# Patient Record
Sex: Female | Born: 1995 | Hispanic: No | Marital: Married | Smoking: Never smoker
Health system: Southern US, Community
[De-identification: ages and names within clinical notes are randomized; demographics above are authoritative.]

---

## 2019-12-30 HISTORY — PX: INCISION AND DRAINAGE PERITONSILLAR ABSCESS: SUR670

## 2020-01-24 ENCOUNTER — Emergency Department (HOSPITAL_BASED_OUTPATIENT_CLINIC_OR_DEPARTMENT_OTHER): Payer: 59

## 2020-01-24 ENCOUNTER — Emergency Department (INDEPENDENT_AMBULATORY_CARE_PROVIDER_SITE_OTHER)
Admission: EM | Admit: 2020-01-24 | Discharge: 2020-01-24 | Disposition: A | Discharge status: Home / Self Care | Payer: 59 | Source: Home / Self Care

## 2020-01-24 ENCOUNTER — Other Ambulatory Visit: Payer: Self-pay

## 2020-01-24 ENCOUNTER — Emergency Department (HOSPITAL_BASED_OUTPATIENT_CLINIC_OR_DEPARTMENT_OTHER)
Admission: EM | Admit: 2020-01-24 | Discharge: 2020-01-25 | Disposition: A | Discharge status: Home / Self Care | Payer: 59 | Attending: Emergency Medicine | Admitting: Emergency Medicine

## 2020-01-24 ENCOUNTER — Encounter: Payer: Self-pay | Admitting: Family Medicine

## 2020-01-24 ENCOUNTER — Encounter (HOSPITAL_BASED_OUTPATIENT_CLINIC_OR_DEPARTMENT_OTHER): Payer: Self-pay | Admitting: *Deleted

## 2020-01-24 DIAGNOSIS — Z20822 Contact with and (suspected) exposure to covid-19: Secondary | ICD-10-CM | POA: Diagnosis not present

## 2020-01-24 DIAGNOSIS — J039 Acute tonsillitis, unspecified: Secondary | ICD-10-CM | POA: Diagnosis not present

## 2020-01-24 DIAGNOSIS — J36 Peritonsillar abscess: Secondary | ICD-10-CM

## 2020-01-24 DIAGNOSIS — J029 Acute pharyngitis, unspecified: Secondary | ICD-10-CM | POA: Diagnosis not present

## 2020-01-24 DIAGNOSIS — R070 Pain in throat: Secondary | ICD-10-CM | POA: Diagnosis present

## 2020-01-24 LAB — CBC WITH DIFFERENTIAL/PLATELET
Abs Immature Granulocytes: 0.07 10*3/uL (ref 0.00–0.07)
Basophils Absolute: 0 10*3/uL (ref 0.0–0.1)
Basophils Relative: 0 %
Eosinophils Absolute: 0 10*3/uL (ref 0.0–0.5)
Eosinophils Relative: 0 %
HCT: 38.7 % (ref 36.0–46.0)
Hemoglobin: 13.5 g/dL (ref 12.0–15.0)
Immature Granulocytes: 0 %
Lymphocytes Relative: 8 %
Lymphs Abs: 1.5 10*3/uL (ref 0.7–4.0)
MCH: 30.5 pg (ref 26.0–34.0)
MCHC: 34.9 g/dL (ref 30.0–36.0)
MCV: 87.4 fL (ref 80.0–100.0)
Monocytes Absolute: 1.3 10*3/uL — ABNORMAL HIGH (ref 0.1–1.0)
Monocytes Relative: 7 %
Neutro Abs: 15.1 10*3/uL — ABNORMAL HIGH (ref 1.7–7.7)
Neutrophils Relative %: 85 %
Platelets: 285 10*3/uL (ref 150–400)
RBC: 4.43 MIL/uL (ref 3.87–5.11)
RDW: 12.8 % (ref 11.5–15.5)
WBC: 18 10*3/uL — ABNORMAL HIGH (ref 4.0–10.5)
nRBC: 0 % (ref 0.0–0.2)

## 2020-01-24 LAB — BASIC METABOLIC PANEL
Anion gap: 10 (ref 5–15)
BUN: 7 mg/dL (ref 6–20)
CO2: 24 mmol/L (ref 22–32)
Calcium: 8.9 mg/dL (ref 8.9–10.3)
Chloride: 99 mmol/L (ref 98–111)
Creatinine, Ser: 0.68 mg/dL (ref 0.44–1.00)
GFR, Estimated: >60 mL/min (ref >60)
Glucose, Bld: 109 mg/dL — ABNORMAL HIGH (ref 70–99)
Potassium: 3.7 mmol/L (ref 3.5–5.1)
Sodium: 133 mmol/L — ABNORMAL LOW (ref 135–145)

## 2020-01-24 LAB — GROUP A STREP BY PCR: Group A Strep by PCR: NOT DETECTED

## 2020-01-24 MED ORDER — PENICILLIN V POTASSIUM 500 MG PO TABS: 500.0000 mg | ORAL_TABLET | Freq: Three times a day (TID) | ORAL | 0 refills | Status: DC

## 2020-01-24 MED ORDER — HYDROCOD POLST-CPM POLST ER 10-8 MG/5ML PO SUER: 5.0000 mL | Freq: Two times a day (BID) | ORAL | 0 refills | Status: DC | PRN

## 2020-01-24 MED ORDER — SODIUM CHLORIDE 0.9 % IV BOLUS
1000.0000 mL | Freq: Once | INTRAVENOUS | Status: AC
  Administered 2020-01-24: 23:00:00 1000 mL via INTRAVENOUS

## 2020-01-24 NOTE — ED Provider Notes (Signed)
Emergency Department Provider Note   I have reviewed the triage vital signs and the nursing notes.   HISTORY  Chief Complaint Sore Throat   HPI Stacy Black is a 24 y.o. female with past medical history reviewed below presents emergency department with continued pain in her right neck and tonsil with associated swelling.  Patient was initially evaluated at [facility] 2 days ago.  She was given Decadron after her strep antigen test came back negative.  She was seen at the urgent care earlier today with continued symptoms and started on penicillin but since that time has had increased pain and swelling in the right neck and pain with swallowing making it difficult for her to do so.  She denies any trouble breathing.  No fevers but has had some fatigue.  Her appetite is very poor. No vomiting. She is here with a friend who provides some of the history and additional context to the case. No CP or SOB. No vomiting or diarrhea. No URI symptoms. Pain is moderate, constant, and worse with swallowing or touching the area.    History reviewed. No pertinent past medical history.  There are no problems to display for this patient.   History reviewed. No pertinent surgical history.  Allergies Patient has no known allergies.  Family History  Problem Relation Age of Onset  . Diabetes Father     Social History Social History   Tobacco Use  . Smoking status: Never Smoker  . Smokeless tobacco: Never Used  Vaping Use  . Vaping Use: Never used  Substance Use Topics  . Alcohol use: Never  . Drug use: Never    Review of Systems  Constitutional: No fever/chills Eyes: No visual changes. ENT: Positive sore throat with right lower jaw swelling.  Cardiovascular: Denies chest pain. Respiratory: Denies shortness of breath. Gastrointestinal: No abdominal pain.  No nausea, no vomiting.  No diarrhea.  No constipation. Poor PO intake.  Genitourinary: Negative for dysuria. Musculoskeletal:  Negative for back pain. Skin: Negative for rash. Neurological: Negative for headaches, focal weakness or numbness.  10-point ROS otherwise negative.  ____________________________________________   PHYSICAL EXAM:  VITAL SIGNS: ED Triage Vitals  Enc Vitals Group     BP 01/24/20 2207 120/82     Pulse Rate 01/24/20 2207 (!) 111     Resp 01/24/20 2207 18     Temp 01/24/20 2207 99.7 F (37.6 C)     Temp Source 01/24/20 2207 Oral     SpO2 01/24/20 2207 100 %     Weight 01/24/20 2208 153 lb (69.4 kg)     Height 01/24/20 2208 5\' 5"  (1.651 m)   Constitutional: Alert and oriented. Well appearing and in no acute distress. Eyes: Conjunctivae are normal.  Head: Atraumatic. Nose: No congestion/rhinnorhea. Mouth/Throat: Mucous membranes are moist.  Oropharynx with erythema and right tonsillar hypertrophy. No clear PTA clinically. Normal uvula. Clear voice and managing oral secretions but 3 finger-breadths of trismus (mild) and tender adenopathy along the right cervical chain. Soft submandibular compartment. No tongue swelling.  Neck: No stridor.   Cardiovascular: Normal rate, regular rhythm. Good peripheral circulation. Grossly normal heart sounds.   Respiratory: Normal respiratory effort.  No retractions. Lungs CTAB. Gastrointestinal: Soft and nontender. No distention.  Musculoskeletal: No gross deformities of extremities. Neurologic:  Normal speech and language.  Skin:  Skin is warm, dry and intact. No rash noted.  ____________________________________________   LABS (all labs ordered are listed, but only abnormal results are displayed)  Labs Reviewed  BASIC METABOLIC PANEL - Abnormal; Notable for the following components:      Result Value   Sodium 133 (*)    Glucose, Bld 109 (*)    All other components within normal limits  CBC WITH DIFFERENTIAL/PLATELET - Abnormal; Notable for the following components:   WBC 18.0 (*)    Neutro Abs 15.1 (*)    Monocytes Absolute 1.3 (*)    All  other components within normal limits  GROUP A STREP BY PCR  RESP PANEL BY RT-PCR (FLU A&B, COVID) [id number]   ____________________________________________  RADIOLOGY  CT Soft Tissue Neck W Contrast  Result Date: 01/25/2020 CLINICAL DATA:  Cellulitis of the neck. EXAM: CT NECK WITH CONTRAST TECHNIQUE: Multidetector CT imaging of the neck was performed using the standard protocol following the bolus administration of intravenous contrast. CONTRAST:  30mL OMNIPAQUE IOHEXOL 300 MG/ML  SOLN COMPARISON:  None. FINDINGS: Pharynx and larynx: Adenoid tissue is within normal limits. Prevertebral edema is noted. There is marked enlargement of the right palatine tonsil with diffuse inflammatory change. Peripherally enhancing low-density collection in the right parapharyngeal space measures 9 x 11 x 17 mm. Diffuse edematous changes extend into the right side of the vallecula and aryepiglottic fold. No other discrete fluid collection is present. Vocal cords are midline and symmetric. Trachea is normal. Salivary glands: Edematous changes surround the right submandibular gland. The right submandibular gland is edematous is well. No mass lesion or obstruction is present. Left submandibular gland and duct is normal. The parotid glands are normal bilaterally. Thyroid: Normal Lymph nodes: Enlarged right level 2 lymph nodes measure up to 2.6 cm. Other smaller ovoid nodes are present throughout the right level 2 and level 3 stations. No necrotic nodes are present. No significant left-sided adenopathy is present. Vascular: Unremarkable. Limited intracranial: Within normal limits. Visualized orbits: The globes and orbits are within normal limits. Mastoids and visualized paranasal sinuses: The paranasal sinuses and mastoid air cells are clear. Skeleton: Vertebral body heights and alignment are normal. There is some reversal of the normal cervical lordosis. Upper chest: Lung apices are clear. Thoracic inlet is within normal limits.  IMPRESSION: 1. Marked enlargement of the right palatine tonsil with diffuse inflammatory change compatible with acute tonsillitis. 2. Peripherally enhancing low-density collection in the right parapharyngeal space measures 9 x 11 x 17 mm, consistent with a developing peritonsillar abscess. 3. Edematous changes extend into the right side of the vallecula and aryepiglottic fold. 4. Enlarged right level 2 and level 3 lymph nodes are likely reactive. Electronically Signed   By: Marin Roberts M.D.   On: 01/25/2020 00:55   DG Chest Portable 1 View  Result Date: 01/24/2020 CLINICAL DATA:  Cough. Sore throat. EXAM: PORTABLE CHEST 1 VIEW COMPARISON:  None. FINDINGS: Heart size is normal. Ill-defined airspace opacity is present the left base. Lungs are otherwise clear. Visualized soft tissues and bony thorax are unremarkable. IMPRESSION: Ill-defined left lower lobe airspace disease concerning for pneumonia. Electronically Signed   By: Marin Roberts M.D.   On: 01/24/2020 23:41    ____________________________________________   PROCEDURES  Procedure(s) performed:   Procedures  None  ____________________________________________   INITIAL IMPRESSION / ASSESSMENT AND PLAN / ED COURSE  Pertinent labs & imaging results that were available during my care of the patient were reviewed by me and considered in my medical decision making (see chart for details).   Patient presents to the emergency department with worsening sore throat with cervical adenopathy and very mild trismus.  She is  managing oral secretions and able to provide a history.  She has been refractory to Decadron and has been started on penicillin with no improvement in symptoms.  Given her exam I will move forward with CT of the soft tissues of the neck to evaluate for deeper space abscess not visualized on exam.   01:00 AM  Spoke with Dr. Pollyann Kennedy regarding the CT imaging and results.  I do not appreciate a large peritonsillar  abscess that would be amenable to ED drainage at the bedside. Discussed the impressions and patient's clinical exam.  Plan to start clindamycin will have the patient follow in the office tomorrow.  She is tolerating her oral secretions and speaking in a clear voice.  She is in no acute distress.  Feeling improved after IV fluids.  IV clindamycin given prior to discharge.  Patient is to call the ENT office in the morning.  The patient's friend is at bedside and will assist her with this in the AM. Discussed ED return precautions.    ____________________________________________  FINAL CLINICAL IMPRESSION(S) / ED DIAGNOSES  Final diagnoses:  Tonsillitis  Peritonsillar abscess     MEDICATIONS GIVEN DURING THIS VISIT:  Medications  sodium chloride 0.9 % bolus 1,000 mL (0 mLs Intravenous Stopped 01/25/20 0044)  iohexol (OMNIPAQUE) 300 MG/ML solution 75 mL (75 mLs Intravenous Contrast Given 01/25/20 0019)  clindamycin (CLEOCIN) IVPB 600 mg (0 mg Intravenous Stopped 01/25/20 0146)     NEW OUTPATIENT MEDICATIONS STARTED DURING THIS VISIT:  Discharge Medication List as of 01/25/2020  1:28 AM    START taking these medications   Details  clindamycin (CLEOCIN) 150 MG capsule Take 3 capsules (450 mg total) by mouth 3 (three) times daily for 7 days., Starting Mon 01/25/2020, Until Mon 02/01/2020, Normal        Note:  This document was prepared using Dragon voice recognition software and may include unintentional dictation errors.  Alona Bene, MD, [facility] Emergency Medicine    Grettell Ransdell, Arlyss Repress, MD 01/25/20 [phone]

## 2020-01-24 NOTE — ED Triage Notes (Signed)
Pt seen in ER 2 days ago for swollen lymph nodes. Pt prescribed steroids. Culture sent off for strep. Pt comes in today for unbearable pain and unable to tolerate PO intake.

## 2020-01-24 NOTE — ED Triage Notes (Addendum)
Pt reports she has had sore throat since 12/22. She has been seen in the ED and then again today at Urgent Care. C/o swollen tonsils and sore throat. She was started on steroids and PCN and had negative rapid strep test today. Denies fever

## 2020-01-24 NOTE — ED Provider Notes (Signed)
[facility]    CSN: [id number] Arrival date & time: 01/24/20  4174      History   Chief Complaint Chief Complaint  Patient presents with  . Lymphadenopathy    Right    HPI Stacy Black is a 24 y.o. female.   This is the initial visit for this 24 year old woman presenting to [facility] urgent care.  She is complaining of sore throat.  It began 4 days ago and has gotten quite intense.  It is also associated with some swollen anterior cervical nodes and cough.  The cough is nonproductive.  Patient has had no fever, chills, or sweats.  Pt seen in ER 2 days ago for swollen lymph nodes. Pt prescribed steroids. Culture sent off for strep. Pt comes in today for unbearable pain and unable to tolerate PO intake.  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     History reviewed. No pertinent past medical history.  There are no problems to display for this patient.   History reviewed. No pertinent surgical history.  OB History   No obstetric history on file.      Home Medications    Prior to Admission medications   Medication Sig Start Date End Date Taking? Authorizing Provider  chlorpheniramine-HYDROcodone (TUSSIONEX PENNKINETIC ER) 10-8 MG/5ML SUER Take 5 mLs by mouth every 12 (twelve) hours as needed for cough. 01/24/20   Elvina Sidle, MD  penicillin v potassium (VEETID) 500 MG tablet Take 1 tablet (500 mg total) by mouth 3 (three) times daily. 01/24/20   Elvina Sidle, MD    Family History Family History  Problem Relation Age of Onset  . Diabetes Father     Social History Social History   Tobacco Use  . Smoking status: Never Smoker  . Smokeless tobacco: Never Used  Vaping Use  . Vaping Use: Never used  Substance Use Topics  . Alcohol use: Never  . Drug use: Never     Allergies   Patient has no known allergies.   Review of Systems Review of Systems  HENT: Positive for sore throat.    Respiratory: Positive for cough.      Physical Exam Triage Vital Signs ED Triage Vitals  Enc Vitals Group     BP      Pulse      Resp      Temp      Temp src      SpO2      Weight      Height      Head Circumference      Peak Flow      Pain Score      Pain Loc      Pain Edu?      Excl. in GC?    No data found.  Updated Vital Signs BP 124/86 (BP Location: Right Arm)   Pulse 80   Temp (!) 97.5 F (36.4 C) (Axillary)   Resp 14   Ht 5\' 5"  (1.651 m)   Wt 69.4 kg   LMP 12/27/2019 (Approximate)   SpO2 100%   BMI 25.46 kg/m    Physical Exam Vitals and nursing note reviewed.  Constitutional:      General: She is not in acute distress.    Appearance: Normal appearance. She is normal weight.  HENT:     Right Ear: Tympanic membrane normal.     Left Ear: Tympanic membrane normal.     Nose: Nose normal.  Mouth/Throat:     Mouth: Mucous membranes are moist.     Pharynx: Posterior oropharyngeal erythema present.  Eyes:     Conjunctiva/sclera: Conjunctivae normal.  Cardiovascular:     Rate and Rhythm: Normal rate.  Pulmonary:     Effort: Pulmonary effort is normal.     Breath sounds: Normal breath sounds.  Abdominal:     General: Abdomen is flat.     Palpations: There is no mass.     Tenderness: There is no abdominal tenderness.  Musculoskeletal:        General: Normal range of motion.     Cervical back: Normal range of motion and neck supple. Tenderness present.  Lymphadenopathy:     Cervical: Cervical adenopathy present.  Skin:    General: Skin is warm and dry.  Neurological:     General: No focal deficit present.     Mental Status: She is alert and oriented to person, place, and time.  Psychiatric:        Mood and Affect: Mood normal.        Behavior: Behavior normal.      UC Treatments / Results  Labs (all labs ordered are listed, but only abnormal results are displayed) Labs Reviewed - No data to display  EKG   Radiology No results  found.  Procedures Procedures (including critical care time)  Medications Ordered in UC Medications - No data to display  Initial Impression / Assessment and Plan / UC Course  I have reviewed the triage vital signs and the nursing notes.  Pertinent labs & imaging results that were available during my care of the patient were reviewed by me and considered in my medical decision making (see chart for details).     Final Clinical Impressions(s) / UC Diagnoses   Final diagnoses:  Acute pharyngitis, unspecified etiology   Discharge Instructions   None    ED Prescriptions    Medication Sig Dispense Auth. Provider   penicillin v potassium (VEETID) 500 MG tablet Take 1 tablet (500 mg total) by mouth 3 (three) times daily. 30 tablet Elvina Sidle, MD   chlorpheniramine-HYDROcodone [facility] ER) 10-8 MG/5ML SUER Take 5 mLs by mouth every 12 (twelve) hours as needed for cough. 115 mL Elvina Sidle, MD     I have reviewed the PDMP during this encounter.   Elvina Sidle, MD 01/24/20 [phone]

## 2020-01-25 ENCOUNTER — Emergency Department (HOSPITAL_BASED_OUTPATIENT_CLINIC_OR_DEPARTMENT_OTHER): Payer: 59

## 2020-01-25 ENCOUNTER — Encounter (HOSPITAL_BASED_OUTPATIENT_CLINIC_OR_DEPARTMENT_OTHER): Payer: Self-pay

## 2020-01-25 LAB — RESP PANEL BY RT-PCR (FLU A&B, COVID) ARPGX2
Influenza A by PCR: NEGATIVE
Influenza B by PCR: NEGATIVE
SARS Coronavirus 2 by RT PCR: NEGATIVE

## 2020-01-25 MED ORDER — CLINDAMYCIN PHOSPHATE 600 MG/50ML IV SOLN
600.0000 mg | Freq: Once | INTRAVENOUS | Status: AC
  Administered 2020-01-25: 01:00:00 600 mg via INTRAVENOUS
  Filled 2020-01-25: qty 50

## 2020-01-25 MED ORDER — CLINDAMYCIN HCL 150 MG PO CAPS: 450.0000 mg | ORAL_CAPSULE | Freq: Three times a day (TID) | ORAL | 0 refills | Status: AC

## 2020-01-25 MED ORDER — IOHEXOL 300 MG/ML  SOLN
75.0000 mL | Freq: Once | INTRAMUSCULAR | Status: AC | PRN
  Administered 2020-01-25: 75 mL via INTRAVENOUS

## 2020-01-25 NOTE — Discharge Instructions (Signed)
You were seen in the emerge department today with throat pain and swelling.  Your CT scan shows a developing peritonsillar abscess with inflammation of your right tonsil.  I am starting you on a stronger antibiotic and you have been given an IV dose of this antibiotic here.  Please call the ear nose and throat doctor listed first thing tomorrow morning to schedule a follow-up appointment later today.  Return to the emergency department with any new or suddenly worsening symptoms.

## 2020-01-30 DIAGNOSIS — I10 Essential (primary) hypertension: Secondary | ICD-10-CM

## 2020-01-30 HISTORY — DX: Essential (primary) hypertension: I10

## 2020-12-13 ENCOUNTER — Emergency Department (HOSPITAL_BASED_OUTPATIENT_CLINIC_OR_DEPARTMENT_OTHER)
Admission: EM | Admit: 2020-12-13 | Discharge: 2020-12-13 | Disposition: A | Discharge status: Home / Self Care | Payer: 59 | Attending: Emergency Medicine | Admitting: Emergency Medicine

## 2020-12-13 ENCOUNTER — Emergency Department (HOSPITAL_BASED_OUTPATIENT_CLINIC_OR_DEPARTMENT_OTHER): Payer: 59 | Admitting: Radiology

## 2020-12-13 ENCOUNTER — Encounter (HOSPITAL_BASED_OUTPATIENT_CLINIC_OR_DEPARTMENT_OTHER): Payer: Self-pay | Admitting: Emergency Medicine

## 2020-12-13 ENCOUNTER — Other Ambulatory Visit: Payer: Self-pay

## 2020-12-13 DIAGNOSIS — X501XXA Overexertion from prolonged static or awkward postures, initial encounter: Secondary | ICD-10-CM | POA: Diagnosis not present

## 2020-12-13 DIAGNOSIS — S93401A Sprain of unspecified ligament of right ankle, initial encounter: Secondary | ICD-10-CM | POA: Insufficient documentation

## 2020-12-13 DIAGNOSIS — S99911A Unspecified injury of right ankle, initial encounter: Secondary | ICD-10-CM | POA: Diagnosis present

## 2020-12-13 DIAGNOSIS — M25571 Pain in right ankle and joints of right foot: Secondary | ICD-10-CM | POA: Diagnosis not present

## 2020-12-13 MED ORDER — NAPROXEN 375 MG PO TABS: 375.0000 mg | ORAL_TABLET | Freq: Two times a day (BID) | ORAL | 0 refills | Status: DC

## 2020-12-13 MED ORDER — NAPROXEN 250 MG PO TABS
375.0000 mg | ORAL_TABLET | Freq: Once | ORAL | Status: AC
  Administered 2020-12-13: 375 mg via ORAL
  Filled 2020-12-13: qty 2

## 2020-12-13 NOTE — ED Provider Notes (Signed)
[facility] EMERGENCY DEPARTMENT Provider Note  CSN: [id number] Arrival date & time: 12/13/20 1324    History Chief Complaint  Patient presents with   Ankle Pain    Stacy Black is a 25 y.o. female reports she stumbled and fell last night twisting her R ankle. Reports moderate pain, worse with movement and standing. No other injuries   History reviewed. No pertinent past medical history.  History reviewed. No pertinent surgical history.  Family History  Problem Relation Age of Onset   Diabetes Father     Social History   Tobacco Use   Smoking status: Never   Smokeless tobacco: Never  Vaping Use   Vaping Use: Never used  Substance Use Topics   Alcohol use: Never   Drug use: Never     Home Medications Prior to Admission medications   Medication Sig Start Date End Date Taking? Authorizing Provider  naproxen (NAPROSYN) 375 MG tablet Take 1 tablet (375 mg total) by mouth 2 (two) times daily. 12/13/20  Yes Pollyann Savoy, MD     Allergies    Patient has no known allergies.   Review of Systems   Review of Systems A comprehensive review of systems was completed and negative except as noted in HPI.    Physical Exam BP 121/81 (BP Location: Right Arm)   Pulse 65   Temp 98.1 F (36.7 C) (Oral)   Resp 15   Ht 5\' 5"  (1.651 m)   Wt 69.4 kg   SpO2 100%   BMI 25.46 kg/m   Physical Exam Vitals and nursing note reviewed.  HENT:     Head: Normocephalic.     Nose: Nose normal.  Eyes:     Extraocular Movements: Extraocular movements intact.  Pulmonary:     Effort: Pulmonary effort is normal.  Musculoskeletal:        General: Swelling and tenderness (soft tissues of R ankle, no bony tenderness) present. No deformity.     Cervical back: Neck supple.  Skin:    Findings: No rash (on exposed skin).  Neurological:     Mental Status: She is alert and oriented to person, place, and time.  Psychiatric:        Mood and Affect: Mood normal.      ED Results / Procedures / Treatments   Labs (all labs ordered are listed, but only abnormal results are displayed) Labs Reviewed - No data to display  EKG None   Radiology DG Ankle Complete Right  Result Date: 12/13/2020 CLINICAL DATA:  25 year old female status post fall twisting ankle last night. EXAM: RIGHT ANKLE - COMPLETE 3+ VIEW COMPARISON:  None. FINDINGS: Bone mineralization is within normal limits. There is no evidence of fracture, dislocation, or joint effusion. There is no evidence of arthropathy or other focal bone abnormality. Soft tissues are unremarkable. IMPRESSION: Negative. Electronically Signed   By: 34 M.D.   On: 12/13/2020 07:41    Procedures Procedures  Medications Ordered in the ED Medications  naproxen (NAPROSYN) tablet 375 mg (has no administration in time range)     MDM Rules/Calculators/A&P MDM  Xray neg. Will provide ASO, crutches. Recommend RICE, naprosyn as needed. Ortho follow up if not improving.  ED Course  I have reviewed the triage vital signs and the nursing notes.  Pertinent labs & imaging results that were available during my care of the patient were reviewed by me and considered in my medical decision making (see chart for details).  Final Clinical Impression(s) / ED Diagnoses Final diagnoses:  Sprain of right ankle, unspecified ligament, initial encounter    Rx / DC Orders ED Discharge Orders          Ordered    naproxen (NAPROSYN) 375 MG tablet  2 times daily        12/13/20 0754             Pollyann Savoy, MD 12/13/20 [phone]

## 2020-12-13 NOTE — ED Triage Notes (Signed)
Pt arrives to ED with c/o of right ankle pain. She reports twisting it last night.

## 2021-01-06 ENCOUNTER — Other Ambulatory Visit: Payer: Self-pay

## 2021-01-06 ENCOUNTER — Encounter (HOSPITAL_BASED_OUTPATIENT_CLINIC_OR_DEPARTMENT_OTHER): Payer: Self-pay

## 2021-01-06 DIAGNOSIS — N3000 Acute cystitis without hematuria: Secondary | ICD-10-CM | POA: Diagnosis not present

## 2021-01-06 DIAGNOSIS — R350 Frequency of micturition: Secondary | ICD-10-CM | POA: Diagnosis present

## 2021-01-06 LAB — URINALYSIS, ROUTINE W REFLEX MICROSCOPIC
Bilirubin Urine: NEGATIVE
Glucose, UA: NEGATIVE mg/dL
Hgb urine dipstick: NEGATIVE
Ketones, ur: NEGATIVE mg/dL
Nitrite: NEGATIVE
Protein, ur: NEGATIVE mg/dL
Specific Gravity, Urine: 1.012 (ref 1.005–1.030)
pH: 6.5 (ref 5.0–8.0)

## 2021-01-06 LAB — PREGNANCY, URINE: Preg Test, Ur: NEGATIVE

## 2021-01-06 NOTE — ED Triage Notes (Signed)
Increased urgency and frequency of urination.

## 2021-01-07 ENCOUNTER — Emergency Department (HOSPITAL_BASED_OUTPATIENT_CLINIC_OR_DEPARTMENT_OTHER)
Admission: EM | Admit: 2021-01-07 | Discharge: 2021-01-07 | Disposition: A | Discharge status: Home / Self Care | Payer: 59 | Attending: Emergency Medicine | Admitting: Emergency Medicine

## 2021-01-07 DIAGNOSIS — N3000 Acute cystitis without hematuria: Secondary | ICD-10-CM

## 2021-01-07 LAB — CBG MONITORING, ED: Glucose-Capillary: 91 mg/dL (ref 70–99)

## 2021-01-07 MED ORDER — CEPHALEXIN 250 MG PO CAPS
500.0000 mg | ORAL_CAPSULE | Freq: Once | ORAL | Status: AC
  Administered 2021-01-07: 500 mg via ORAL
  Filled 2021-01-07: qty 2

## 2021-01-07 MED ORDER — CEPHALEXIN 500 MG PO CAPS: 500.0000 mg | ORAL_CAPSULE | Freq: Three times a day (TID) | ORAL | 0 refills | Status: DC

## 2021-01-07 NOTE — ED Provider Notes (Signed)
[facility] Provider Note   CSN: [id number] Arrival date & time: 01/06/21  2320     History Chief Complaint  Patient presents with   Urinary Frequency    Fair Brahmbhatt is a 25 y.o. female.  Patient reports a 1 week history of increased urination, frequency, urgency, going small amounts.  Denies any hematuria.  Denies any vaginal bleeding or discharge.  Denies any fevers, chills, nausea or vomiting. Denies any significant flank pain but does have diffuse low back pain over the past 1 week.  No abdominal pain.  No fever or chills.  Is never had the symptoms before. Describes urinary frequency, urgency, going small amounts with some burning and discomfort when she tries to urinate.  The history is provided by the patient.  Urinary Frequency Pertinent negatives include no chest pain, no abdominal pain, no headaches and no shortness of breath.      History reviewed. No pertinent past medical history.  There are no problems to display for this patient.   History reviewed. No pertinent surgical history.   OB History   No obstetric history on file.     Family History  Problem Relation Age of Onset   Diabetes Father     Social History   Tobacco Use   Smoking status: Never   Smokeless tobacco: Never  Vaping Use   Vaping Use: Never used  Substance Use Topics   Alcohol use: Yes   Drug use: Never    Home Medications Prior to Admission medications   Medication Sig Start Date End Date Taking? Authorizing Provider  naproxen (NAPROSYN) 375 MG tablet Take 1 tablet (375 mg total) by mouth 2 (two) times daily. 12/13/20   Truddie Hidden, MD    Allergies    Patient has no known allergies.  Review of Systems   Review of Systems  Constitutional:  Negative for activity change, appetite change and fever.  HENT:  Negative for congestion and rhinorrhea.   Respiratory:  Negative for cough, chest tightness and shortness of breath.    Cardiovascular:  Negative for chest pain.  Gastrointestinal:  Negative for abdominal pain, nausea and vomiting.  Genitourinary:  Positive for dysuria, frequency and urgency. Negative for decreased urine volume, hematuria and vaginal discharge.  Musculoskeletal:  Negative for arthralgias and myalgias.  Skin:  Negative for rash.  Neurological:  Negative for dizziness, weakness and headaches.   all other systems are negative except as noted in the HPI and PMH.   Physical Exam Updated Vital Signs BP 129/87   Pulse 78   Temp 98.2 F (36.8 C) (Oral)   Resp 14   Ht 5\' 6"  (1.676 m)   Wt 71 kg   LMP 12/14/2020 (Approximate)   SpO2 100%   BMI 25.26 kg/m   Physical Exam Vitals and nursing note reviewed.  Constitutional:      General: She is not in acute distress.    Appearance: She is well-developed.  HENT:     Head: Normocephalic and atraumatic.     Mouth/Throat:     Pharynx: No oropharyngeal exudate.  Eyes:     Conjunctiva/sclera: Conjunctivae normal.     Pupils: Pupils are equal, round, and reactive to light.  Neck:     Comments: No meningismus. Cardiovascular:     Rate and Rhythm: Normal rate and regular rhythm.     Heart sounds: Normal heart sounds. No murmur heard. Pulmonary:     Effort: Pulmonary effort is normal. No respiratory distress.  Breath sounds: Normal breath sounds.  Abdominal:     Palpations: Abdomen is soft.     Tenderness: There is no abdominal tenderness. There is no guarding or rebound.     Comments: No RLQ tenderness  Musculoskeletal:        General: Tenderness present. Normal range of motion.     Cervical back: Normal range of motion and neck supple.     Comments: Paraspinal lumbar tenderness  Skin:    General: Skin is warm.  Neurological:     Mental Status: She is alert and oriented to person, place, and time.     Cranial Nerves: No cranial nerve deficit.     Motor: No abnormal muscle tone.     Coordination: Coordination normal.      Comments:  5/5 strength throughout. CN 2-12 intact.Equal grip strength.   Psychiatric:        Behavior: Behavior normal.    ED Results / Procedures / Treatments   Labs (all labs ordered are listed, but only abnormal results are displayed) Labs Reviewed  URINALYSIS, ROUTINE W REFLEX MICROSCOPIC - Abnormal; Notable for the following components:      Result Value   APPearance HAZY (*)    Leukocytes,Ua MODERATE (*)    Bacteria, UA RARE (*)    All other components within normal limits  PREGNANCY, URINE  CBG MONITORING, ED    EKG None  Radiology No results found.  Procedures Procedures   Medications Ordered in ED Medications  cephALEXin (KEFLEX) capsule 500 mg (has no administration in time range)    ED Course  I have reviewed the triage vital signs and the nursing notes.  Pertinent labs & imaging results that were available during my care of the patient were reviewed by me and considered in my medical decision making (see chart for details).    MDM Rules/Calculators/A&P                          1 week of UTI symptoms.  No fever or abdominal pain or vomiting. Abdomen soft without peritoneal signs.  Low suspicion for infected kidney stone.  Urinalysis shows white cells and leukocyte esterase. HCG negative. Will send for culture and treat empirically for UTI  Return to the ED with worsening pain, fever, vomiting, any other concerns. Final Clinical Impression(s) / ED Diagnoses Final diagnoses:  Acute cystitis without hematuria    Rx / DC Orders ED Discharge Orders     None        Brieann Osinski, Jeannett Senior, MD 01/07/21 [phone]

## 2021-01-07 NOTE — Discharge Instructions (Signed)
Take antibiotics as prescribed and follow-up with your doctor.  Return to the ED with worsening pain, fever, vomiting, any other concerns

## 2021-01-09 LAB — URINE CULTURE: Culture: >=100000 — AB

## 2021-01-10 ENCOUNTER — Telehealth: Payer: Self-pay | Admitting: Emergency Medicine

## 2021-01-10 NOTE — Telephone Encounter (Signed)
Post ED Visit - Positive Culture Follow-up  Culture report reviewed by antimicrobial stewardship pharmacist: [facility] []  , Pharm.D. []  Enzo Bi, Pharm.D., BCPS AQ-ID []  , Pharm.D., BCPS []  Celedonio Miyamoto, .D., BCPS []  [place], .D., BCPS, AAHIVP []  Georgina Pillion, Pharm.D., BCPS, AAHIVP []  [address], PharmD, BCPS []  , PharmD, BCPS []  [place], PharmD, BCPS []  [place], PharmD []  , PharmD, BCPS []  Estella Husk, PharmD  Pharmacy Team []  Lysle Pearl, PharmD []  , PharmD []  Phillips Climes, PharmD []  , Rph []  Agapito Games) , PharmD []  Verlan Friends, PharmD []  , PharmD []  Mervyn Gay, PharmD []  , PharmD []  Vinnie Level, PharmD []  [facility], PharmD []  , PharmD []  Len Childs, PharmD   Positive urine culture Treated with cephalexin, organism sensitive to the same and no further patient follow-up is required at this time.  01/10/2021, 9:07 AM

## 2021-05-03 ENCOUNTER — Encounter (HOSPITAL_BASED_OUTPATIENT_CLINIC_OR_DEPARTMENT_OTHER): Payer: Self-pay | Admitting: Emergency Medicine

## 2021-05-03 ENCOUNTER — Other Ambulatory Visit: Payer: Self-pay

## 2021-05-03 ENCOUNTER — Emergency Department (HOSPITAL_BASED_OUTPATIENT_CLINIC_OR_DEPARTMENT_OTHER)
Admission: EM | Admit: 2021-05-03 | Discharge: 2021-05-03 | Disposition: A | Discharge status: Home / Self Care | Payer: 59 | Attending: Emergency Medicine | Admitting: Emergency Medicine

## 2021-05-03 DIAGNOSIS — R519 Headache, unspecified: Secondary | ICD-10-CM | POA: Diagnosis not present

## 2021-05-03 DIAGNOSIS — R200 Anesthesia of skin: Secondary | ICD-10-CM | POA: Diagnosis not present

## 2021-05-03 DIAGNOSIS — I10 Essential (primary) hypertension: Secondary | ICD-10-CM | POA: Insufficient documentation

## 2021-05-03 NOTE — Discharge Instructions (Addendum)
It was a pleasure taking care of you today!  ? ?You may take over the counter 1,000 mg tylenol every 6 hours or ibuprofen every 6 hours as needed for pain for no more than 7 days. You may also practice square breathing or journal as detailed during your visit to aid with your stressors. Return to the emergency department if you are experiencing, vision changes, chest pain, shortness of breath, or worsening symptoms. ?

## 2021-05-03 NOTE — ED Provider Notes (Signed)
?[facility] EM[facility] DEPT ?Provider Note ? ? ?CSN: [id number] ?Arrival date & time: 05/03/21  1220 ? ?  ? ?History ? ?Chief Complaint  ?Patient presents with  ? Headache  ? ? ?Stacy Black is a 26 y.o. female who presents to the emergency department complaining of a headache onset 6 days. Patient notes that her headache is resolved at this time.  Patient denies a past medical history of hypertension, however noted that her blood pressure prior to arrival was 160/140 however it decreased to 120/100 after 10 minutes.  Patient is she has had increased stressors at work 6 days ago prior to the onset of her symptoms.  Patient notes that she has bilateral finger/toe numbness, phonophobia, abdominal cramping.  She notes that her symptoms are noticeable when she feels stressed.  She denies a past medical history of anxiety or being treated for anxiety at this time. She has not tried any medications for her symptoms.  Patient denies chest pain, shortness of breath, vision changes, photophobia. ? ? ?The history is provided by the patient. No language interpreter was used.  ?Headache ?Associated symptoms: numbness (bilateral toes/fingers)   ?Associated symptoms: no abdominal pain, no dizziness, no fever, no nausea, no photophobia and no vomiting   ? ?  ? ?Home Medications ?Prior to Admission medications   ?Medication Sig Start Date End Date Taking? Authorizing Provider  ?cephALEXin (KEFLEX) 500 MG capsule Take 1 capsule (500 mg total) by mouth 3 (three) times daily. 01/07/21   Rancour, Jeannett Senior, MD  ?naproxen (NAPROSYN) 375 MG tablet Take 1 tablet (375 mg total) by mouth 2 (two) times daily. 12/13/20   Pollyann Savoy, MD  ?   ? ?Allergies    ?Patient has no known allergies.   ? ?Review of Systems   ?Review of Systems  ?Constitutional:  Positive for chills. Negative for fever.  ?Eyes:  Negative for photophobia and visual disturbance.  ?Respiratory:  Negative for shortness of breath.   ?Cardiovascular:  Positive  for palpitations. Negative for chest pain.  ?Gastrointestinal:  Negative for abdominal pain, nausea and vomiting.  ?Genitourinary:  Negative for dysuria and hematuria.  ?Neurological:  Positive for light-headedness, numbness (bilateral toes/fingers) and headaches. Negative for dizziness.  ?All other systems reviewed and are negative. ? ?Physical Exam ?Updated Vital Signs ?BP 128/84   Pulse 94   Temp 98 ?F (36.7 ?C)   Resp 20   SpO2 100%  ?Physical Exam ?Vitals and nursing note reviewed.  ?Constitutional:   ?   General: She is not in acute distress. ?   Appearance: She is not diaphoretic.  ?HENT:  ?   Head: Normocephalic and atraumatic.  ?   Mouth/Throat:  ?   Pharynx: No oropharyngeal exudate.  ?Eyes:  ?   General: No scleral icterus. ?   Conjunctiva/sclera: Conjunctivae normal.  ?Cardiovascular:  ?   Rate and Rhythm: Normal rate and regular rhythm.  ?   Pulses: Normal pulses.  ?   Heart sounds: Normal heart sounds.  ?Pulmonary:  ?   Effort: Pulmonary effort is normal. No respiratory distress.  ?   Breath sounds: Normal breath sounds. No wheezing.  ?Abdominal:  ?   General: Bowel sounds are normal.  ?   Palpations: Abdomen is soft. There is no mass.  ?   Tenderness: There is no abdominal tenderness. There is no guarding or rebound.  ?Musculoskeletal:     ?   General: Normal range of motion.  ?   Cervical back: Normal range  of motion and neck supple.  ?   Comments: Strength and sensation intact to bilateral upper and lower extremities.  ?Skin: ?   General: Skin is warm and dry.  ?Neurological:  ?   General: No focal deficit present.  ?   Mental Status: She is alert.  ?   Cranial Nerves: Cranial nerves 2-12 are intact.  ?   Sensory: Sensation is intact.  ?   Motor: Motor function is intact. No pronator drift.  ?Psychiatric:     ?   Behavior: Behavior normal.  ? ? ?ED Results / Procedures / Treatments   ?Labs ?(all labs ordered are listed, but only abnormal results are displayed) ?Labs Reviewed - No data to  display ? ?EKG ?None ? ?Radiology ?No results found. ? ?Procedures ?Procedures  ? ? ?Medications Ordered in ED ?Medications - No data to display ? ?ED Course/ Medical Decision Making/ A&P ?  ?                        ?Medical Decision Making ? ?Pt presented to the ED with headache onset 6 days.  She has a history of headaches before in the past.  Denies vision changes, chest pain, shortness of breath, dysuria, hematuria, photophobia.  Vital signs stable, patient afebrile, not tachycardic or hypoxic.  On exam patient without focal neurological deficits.  No vision changes.  No cardiovascular, respiratory, abdominal exam findings.  No spinal tenderness to palpation.  Differential diagnosis includes SAH, ICH, migraine, tension headache.  ? ? ?Disposition: ?Patient presentation suspicious for headache in the setting of recent increased work stressors and increased anxiety. Presentation less likely due to [facility] or ICH due to the absence of red flags. After consideration of the diagnostic results and the patients response to treatment, I feel that the patient would benefit from Discharge home.  Offered patient Tylenol or ibuprofen prior to discharge, patient notes that her headache is resolved and does not feel the need for medication at this time.  Discussed with patient that we may obtain a CT head however less likely at this time with concerns for intracranial abnormality.  Through shared decision making with patient and significant other at bedside, decision to forego CT head at this time was made.  I agree with this decision as patient does not have any focal neurological deficits on exam and there is an absence of red flags. Discussed with patient they may follow-up with their primary care provider as needed.  Supportive care measures and strict return precautions discussed with patient.  Patient knowledges and verbalized understanding.  Patient agreeable to discharge treatment plan. Patient appears safe for discharge  at this time.  Follow-up as indicated in the discharge paperwork. ? ? ?This chart was dictated using voice recognition software, Dragon. Despite the best efforts of this provider to proofread and correct errors, errors may still occur which can change documentation meaning. ? ?Final Clinical Impression(s) / ED Diagnoses ?Final diagnoses:  ?Acute nonintractable headache, unspecified headache type  ? ? ?Rx / DC Orders ?ED Discharge Orders   ? ? None  ? ?  ? ? ?  ?Maccoy Haubner A, PA-C ?05/03/21 2351 ? ?  ?Cheryll Cockayne, MD ?05/08/21 2314 ? ?

## 2021-05-03 NOTE — ED Triage Notes (Signed)
Pt complains of headache/numbness to her fingers bilaterally and pain in both legs. She states she thought her blood pressure was high, checked at home it was 120/90 and a 160/101. ?

## 2021-08-21 DIAGNOSIS — Z01419 Encounter for gynecological examination (general) (routine) without abnormal findings: Secondary | ICD-10-CM | POA: Diagnosis not present

## 2021-08-21 DIAGNOSIS — Z1151 Encounter for screening for human papillomavirus (HPV): Secondary | ICD-10-CM | POA: Diagnosis not present

## 2021-08-21 DIAGNOSIS — Z6826 Body mass index (BMI) 26.0-26.9, adult: Secondary | ICD-10-CM | POA: Diagnosis not present

## 2021-10-21 ENCOUNTER — Other Ambulatory Visit: Payer: Self-pay

## 2021-10-21 ENCOUNTER — Encounter (HOSPITAL_BASED_OUTPATIENT_CLINIC_OR_DEPARTMENT_OTHER): Payer: Self-pay

## 2021-10-21 DIAGNOSIS — O26891 Other specified pregnancy related conditions, first trimester: Secondary | ICD-10-CM | POA: Insufficient documentation

## 2021-10-21 DIAGNOSIS — Z3A01 Less than 8 weeks gestation of pregnancy: Secondary | ICD-10-CM | POA: Diagnosis not present

## 2021-10-21 DIAGNOSIS — R1030 Lower abdominal pain, unspecified: Secondary | ICD-10-CM | POA: Diagnosis not present

## 2021-10-21 DIAGNOSIS — R42 Dizziness and giddiness: Secondary | ICD-10-CM | POA: Diagnosis not present

## 2021-10-21 DIAGNOSIS — O219 Vomiting of pregnancy, unspecified: Secondary | ICD-10-CM | POA: Insufficient documentation

## 2021-10-21 LAB — CBC
HCT: 38.4 % (ref 36.0–46.0)
Hemoglobin: 13.7 g/dL (ref 12.0–15.0)
MCH: 29.8 pg (ref 26.0–34.0)
MCHC: 35.7 g/dL (ref 30.0–36.0)
MCV: 83.7 fL (ref 80.0–100.0)
Platelets: 297 10*3/uL (ref 150–400)
RBC: 4.59 MIL/uL (ref 3.87–5.11)
RDW: 12.6 % (ref 11.5–15.5)
WBC: 16.2 10*3/uL — ABNORMAL HIGH (ref 4.0–10.5)
nRBC: 0 % (ref 0.0–0.2)

## 2021-10-21 NOTE — ED Triage Notes (Signed)
Patient arrives with complaints of dizziness, nausea/vomiting, and abdominal pain. Patient states that she is about [redacted] weeks pregnant. No vaginal bleeding.   Reports increased fatigue and weakness as well. Rates pain a 7/10.

## 2021-10-22 ENCOUNTER — Emergency Department (HOSPITAL_COMMUNITY): Payer: BC Managed Care – PPO

## 2021-10-22 ENCOUNTER — Emergency Department (HOSPITAL_BASED_OUTPATIENT_CLINIC_OR_DEPARTMENT_OTHER)
Admission: EM | Admit: 2021-10-22 | Discharge: 2021-10-22 | Disposition: A | Discharge status: Home / Self Care | Payer: BC Managed Care – PPO | Attending: Emergency Medicine | Admitting: Emergency Medicine

## 2021-10-22 ENCOUNTER — Encounter (HOSPITAL_COMMUNITY): Payer: Self-pay

## 2021-10-22 DIAGNOSIS — O26891 Other specified pregnancy related conditions, first trimester: Secondary | ICD-10-CM | POA: Diagnosis not present

## 2021-10-22 DIAGNOSIS — Z3A01 Less than 8 weeks gestation of pregnancy: Secondary | ICD-10-CM | POA: Diagnosis not present

## 2021-10-22 DIAGNOSIS — R1030 Lower abdominal pain, unspecified: Secondary | ICD-10-CM | POA: Diagnosis not present

## 2021-10-22 DIAGNOSIS — O219 Vomiting of pregnancy, unspecified: Secondary | ICD-10-CM

## 2021-10-22 DIAGNOSIS — Z3490 Encounter for supervision of normal pregnancy, unspecified, unspecified trimester: Secondary | ICD-10-CM

## 2021-10-22 LAB — URINALYSIS, ROUTINE W REFLEX MICROSCOPIC
Bilirubin Urine: NEGATIVE
Glucose, UA: NEGATIVE mg/dL
Hgb urine dipstick: NEGATIVE
Ketones, ur: NEGATIVE mg/dL
Leukocytes,Ua: NEGATIVE
Nitrite: NEGATIVE
Protein, ur: NEGATIVE mg/dL
Specific Gravity, Urine: 1.01 (ref 1.005–1.030)
pH: 7.5 (ref 5.0–8.0)

## 2021-10-22 LAB — COMPREHENSIVE METABOLIC PANEL
ALT: 22 U/L (ref 0–44)
AST: 15 U/L (ref 15–41)
Albumin: 4.4 g/dL (ref 3.5–5.0)
Alkaline Phosphatase: 70 U/L (ref 38–126)
Anion gap: 12 (ref 5–15)
BUN: 9 mg/dL (ref 6–20)
CO2: 18 mmol/L — ABNORMAL LOW (ref 22–32)
Calcium: 9.7 mg/dL (ref 8.9–10.3)
Chloride: 103 mmol/L (ref 98–111)
Creatinine, Ser: 0.55 mg/dL (ref 0.44–1.00)
GFR, Estimated: >60 mL/min (ref >60)
Glucose, Bld: 102 mg/dL — ABNORMAL HIGH (ref 70–99)
Potassium: 3.8 mmol/L (ref 3.5–5.1)
Sodium: 133 mmol/L — ABNORMAL LOW (ref 135–145)
Total Bilirubin: 0.5 mg/dL (ref 0.3–1.2)
Total Protein: 7.6 g/dL (ref 6.5–8.1)

## 2021-10-22 LAB — HCG, QUANTITATIVE, PREGNANCY: hCG, Beta Chain, Quant, S: 57251 m[IU]/mL — ABNORMAL HIGH (ref <5)

## 2021-10-22 LAB — PREGNANCY, URINE: Preg Test, Ur: POSITIVE — AB

## 2021-10-22 LAB — LIPASE, BLOOD: Lipase: 12 U/L (ref 11–51)

## 2021-10-22 MED ORDER — NITROFURANTOIN MONOHYD MACRO 100 MG PO CAPS: 100.0000 mg | ORAL_CAPSULE | Freq: Two times a day (BID) | ORAL | 0 refills | Status: DC

## 2021-10-22 MED ORDER — ONDANSETRON HCL 4 MG/2ML IJ SOLN
4.0000 mg | Freq: Once | INTRAMUSCULAR | Status: AC
  Administered 2021-10-22: 4 mg via INTRAVENOUS
  Filled 2021-10-22: qty 2

## 2021-10-22 MED ORDER — SODIUM CHLORIDE 0.9 % IV BOLUS
500.0000 mL | Freq: Once | INTRAVENOUS | Status: AC
  Administered 2021-10-22: 500 mL via INTRAVENOUS

## 2021-10-22 MED ORDER — PROMETHAZINE HCL 12.5 MG PO TABS: 12.5000 mg | ORAL_TABLET | Freq: Four times a day (QID) | ORAL | 0 refills | Status: DC | PRN

## 2021-10-22 NOTE — ED Notes (Signed)
Pt instructed to present to MAU at [facility]. Pt and significant other both verbalized understanding. IV secured. Pt ambulated from ED with steady gait.

## 2021-10-22 NOTE — MAU Note (Signed)
..  Stacy Black is a 26 y.o. at Unknown here in MAU reporting from [facility] ED: Lower abdominal pain and vomiting, reports she was given zofran in ED. Reports she was not able to eat anything for the last 5 days.  LMP: 09/05/2021  Pain score: 5/10 Vitals:   10/22/21 0300 10/22/21 0405  BP: 104/67 106/69  Pulse: 71 81  Resp: 18 17  Temp: 98.7 F (37.1 C) 98.1 F (36.7 C)  SpO2: 100% 100%

## 2021-10-22 NOTE — ED Provider Notes (Signed)
[facility] DE[facility] Provider Note   CSN: [id number] Arrival date & time: 10/21/21  2255     History  Chief Complaint  Patient presents with   Emesis   Dizziness   Abdominal Pain    Stacy Black is a 26 y.o. female.  The history is provided by the patient.  Emesis Severity:  Moderate Timing:  Intermittent Quality:  Stomach contents Associated symptoms: abdominal pain   Associated symptoms: no fever   Dizziness Associated symptoms: vomiting   Abdominal Pain Associated symptoms: vomiting   Associated symptoms: no fever and no vaginal bleeding   Illness Location:  Body Quality:  Nausea and vomiting and lower abdominal pain.  LMP September 05 2021.  G1P0 Severity:  Moderate Onset quality:  Gradual Timing:  Constant Progression:  Unchanged Chronicity:  New Context:  Pregnant x 6 weeks Relieved by:  Nothing Worsened by:  Nothing Ineffective treatments:  None Associated symptoms: abdominal pain and vomiting   Associated symptoms: no fever   Patient states she is B positive.       Home Medications Prior to Admission medications   Medication Sig Start Date End Date Taking? Authorizing Provider  nitrofurantoin, macrocrystal-monohydrate, (MACROBID) 100 MG capsule Take 1 capsule (100 mg total) by mouth 2 (two) times daily. X 7 days 10/22/21  Yes Harwood Nall, MD  cephALEXin (KEFLEX) 500 MG capsule Take 1 capsule (500 mg total) by mouth 3 (three) times daily. 01/07/21   Rancour, Annie Main, MD  naproxen (NAPROSYN) 375 MG tablet Take 1 tablet (375 mg total) by mouth 2 (two) times daily. 12/13/20   Truddie Hidden, MD      Allergies    Patient has no known allergies.    Review of Systems   Review of Systems  Constitutional:  Negative for fever.  HENT:  Negative for facial swelling.   Eyes:  Negative for redness.  Gastrointestinal:  Positive for abdominal pain and vomiting.  Genitourinary:  Negative for vaginal bleeding.  Neurological:  Positive  for light-headedness.  All other systems reviewed and are negative.   Physical Exam Updated Vital Signs BP 100/65   Pulse 79   Temp 98.9 F (37.2 C) (Oral)   Resp 18   Ht 5\' 6"  (1.676 m)   Wt 73.5 kg   LMP 09/05/2021   SpO2 100%   BMI 26.15 kg/m  Physical Exam Vitals and nursing note reviewed. Exam conducted with a chaperone present.  Constitutional:      General: She is not in acute distress.    Appearance: Normal appearance. She is well-developed.  HENT:     Head: Normocephalic and atraumatic.     Nose: Nose normal.  Eyes:     Pupils: Pupils are equal, round, and reactive to light.  Cardiovascular:     Rate and Rhythm: Normal rate and regular rhythm.     Pulses: Normal pulses.     Heart sounds: Normal heart sounds.  Pulmonary:     Effort: Pulmonary effort is normal. No respiratory distress.     Breath sounds: Normal breath sounds.  Abdominal:     General: Abdomen is flat. Bowel sounds are normal. There is no distension.     Palpations: Abdomen is soft.     Tenderness: There is no abdominal tenderness. There is no guarding or rebound.  Genitourinary:    Vagina: No vaginal discharge.  Musculoskeletal:        General: Normal range of motion.     Cervical back: Neck supple.  Skin:    General: Skin is warm and dry.     Capillary Refill: Capillary refill takes less than 2 seconds.     Findings: No erythema or rash.  Neurological:     General: No focal deficit present.     Mental Status: She is alert.     Deep Tendon Reflexes: Reflexes normal.  Psychiatric:        Mood and Affect: Mood normal.     ED Results / Procedures / Treatments   Labs (all labs ordered are listed, but only abnormal results are displayed) Results for orders placed or performed during the hospital encounter of 10/22/21  Lipase, blood  Result Value Ref Range   Lipase 12 11 - 51 U/L  Comprehensive metabolic panel  Result Value Ref Range   Sodium 133 (L) 135 - 145 mmol/L   Potassium 3.8  3.5 - 5.1 mmol/L   Chloride 103 98 - 111 mmol/L   CO2 18 (L) 22 - 32 mmol/L   Glucose, Bld 102 (H) 70 - 99 mg/dL   BUN 9 6 - 20 mg/dL   Creatinine, Ser 7.82 0.44 - 1.00 mg/dL   Calcium 9.7 8.9 - 95.6 mg/dL   Total Protein 7.6 6.5 - 8.1 g/dL   Albumin 4.4 3.5 - 5.0 g/dL   AST 15 15 - 41 U/L   ALT 22 0 - 44 U/L   Alkaline Phosphatase 70 38 - 126 U/L   Total Bilirubin 0.5 0.3 - 1.2 mg/dL   GFR, Estimated >21 >30 mL/min   Anion gap 12 5 - 15  CBC  Result Value Ref Range   WBC 16.2 (H) 4.0 - 10.5 K/uL   RBC 4.59 3.87 - 5.11 MIL/uL   Hemoglobin 13.7 12.0 - 15.0 g/dL   HCT 86.5 78.4 - 69.6 %   MCV 83.7 80.0 - 100.0 fL   MCH 29.8 26.0 - 34.0 pg   MCHC 35.7 30.0 - 36.0 g/dL   RDW 29.5 28.4 - 13.2 %   Platelets 297 150 - 400 K/uL   nRBC 0.0 0.0 - 0.2 %  Urinalysis, Routine w reflex microscopic  Result Value Ref Range   Color, Urine YELLOW YELLOW   APPearance HAZY (A) CLEAR   Specific Gravity, Urine 1.010 1.005 - 1.030   pH 7.5 5.0 - 8.0   Glucose, UA NEGATIVE NEGATIVE mg/dL   Hgb urine dipstick NEGATIVE NEGATIVE   Bilirubin Urine NEGATIVE NEGATIVE   Ketones, ur NEGATIVE NEGATIVE mg/dL   Protein, ur NEGATIVE NEGATIVE mg/dL   Nitrite NEGATIVE NEGATIVE   Leukocytes,Ua NEGATIVE NEGATIVE   RBC / HPF 0-5 0 - 5 RBC/hpf   WBC, UA 0-5 0 - 5 WBC/hpf   Bacteria, UA RARE (A) NONE SEEN   Squamous Epithelial / LPF 0-5 0 - 5   Mucus PRESENT    Hyaline Casts, UA PRESENT   Pregnancy, urine  Result Value Ref Range   Preg Test, Ur POSITIVE (A) NEGATIVE  hCG, quantitative, pregnancy  Result Value Ref Range   hCG, Beta Chain, Quant, S 57,251 (H) <5 mIU/mL   No results found.  EKG EKG Interpretation  Date/Time:  Saturday October 21 2021 23:38:58 EDT Ventricular Rate:  74 PR Interval:  120 QRS Duration: 74 QT Interval:  384 QTC Calculation: 426 R Axis:   62 Text Interpretation: Normal sinus rhythm with sinus arrhythmia Confirmed by Nicanor Alcon, Aysel Gilchrest ([id number]) on 10/21/2021 11:47:06  PM  Radiology No results found.  Procedures Procedures    Medications Ordered  in ED Medications  sodium chloride 0.9 % bolus 500 mL (0 mLs Intravenous Stopped 10/22/21 0211)  ondansetron (ZOFRAN) injection 4 mg (4 mg Intravenous Given 10/22/21 0141)    ED Course/ Medical Decision Making/ A&P                           Medical Decision Making Patient who is approximately [redacted] weeks pregnant and nausea and vomiting and abdominal pain   Amount and/or Complexity of Data Reviewed Independent Historian: spouse    Details: See above  External Data Reviewed:     Details: Previous notes reviewed Labs: ordered.    Details: Pregnancy test is positive.  Beta HCG is 57, 25.  Urine with small bacteria.  White count 16.2 normal hemoglobin and platelet counts.  Normal lipase 12.  Sodium slight low 133, normal potassium 3.8 normal creatinine .55  Discussion of management or test interpretation with external provider(s): Case d/w Dr. Despina Hidden, transfer to MAU for testing   Risk Prescription drug management. Risk Details: POV to MAU for further testing and care.  Macrobid for asymptomatic bacteuria given.      Final Clinical Impression(s) / ED Diagnoses Final diagnoses:  None    Rx / DC Orders ED Discharge Orders          Ordered    nitrofurantoin, macrocrystal-monohydrate, (MACROBID) 100 MG capsule  2 times daily        10/22/21 0253              Lekendrick Alpern, MD 10/22/21 2774

## 2021-10-22 NOTE — MAU Provider Note (Signed)
History     CSN: [id number]  Arrival date and time: 10/21/21 2255   Event Date/Time   First Provider Initiated Contact with Patient 10/22/21 [phone]      Chief Complaint  Patient presents with   Emesis   Dizziness   Abdominal Pain   HPI Stacy Black is a 26 y.o. G1P0 at [redacted]w[redacted]d who presents to MAU from the ED at [facility] with chief complaint of abdominal pain, emesis and dizziness in the setting of pregnancy of unknown location. These are recurrent problems, onset five days ago. Patient reports vomiting 4-5 times per day. She does not have medication to help her manage this problem.  She reports feeling dizzy whenever she stands or sits with her legs in a dependent position. She denies chest pain, palpitations, weakness or syncope. She is not experiencing vaginal bleeding, dysuria, back pain or abnormal vaginal discharge.  OB History     Gravida  1   Para      Term      Preterm      AB      Living         SAB      IAB      Ectopic      Multiple      Live Births              No past medical history on file.  No past surgical history on file.  Family History  Problem Relation Age of Onset   Diabetes Father     Social History   Tobacco Use   Smoking status: Never   Smokeless tobacco: Never  Vaping Use   Vaping Use: Never used  Substance Use Topics   Alcohol use: Yes   Drug use: Never    Allergies: No Known Allergies  Medications Prior to Admission  Medication Sig Dispense Refill Last Dose   cephALEXin (KEFLEX) 500 MG capsule Take 1 capsule (500 mg total) by mouth 3 (three) times daily. 21 capsule 0    naproxen (NAPROSYN) 375 MG tablet Take 1 tablet (375 mg total) by mouth 2 (two) times daily. 15 tablet 0     Review of Systems  Constitutional:  Positive for fatigue.  Gastrointestinal:  Positive for abdominal pain, nausea and vomiting.  Neurological:  Positive for dizziness.  All other systems reviewed and are negative.  Physical Exam    Blood pressure 116/67, pulse 68, temperature 98.4 F (36.9 C), temperature source Oral, resp. rate 17, height 5\' 6"  (1.676 m), weight 74.5 kg, last menstrual period 09/05/2021, SpO2 100 %.  Physical Exam Vitals and nursing note reviewed. Exam conducted with a chaperone present.  Constitutional:      Appearance: She is well-developed. She is not ill-appearing.  Cardiovascular:     Rate and Rhythm: Normal rate.  Pulmonary:     Effort: Pulmonary effort is normal.  Abdominal:     General: Abdomen is flat.     Palpations: Abdomen is soft.  Skin:    Capillary Refill: Capillary refill takes less than 2 seconds.  Neurological:     Mental Status: She is alert and oriented to person, place, and time.     MAU Course  Procedures  MDM Orders Placed This Encounter  Procedures   US OB LESS THAN 14 WEEKS WITH OB TRANSVAGINAL   Diet NPO time specified   Discharge patient   Patient Vitals for the past 24 hrs:  BP Temp Temp src Pulse Resp SpO2 Height Weight  10/22/21 0532 116/67 98.4 F (36.9 C) Oral 68 17 100 % -- --  10/22/21 0405 106/69 98.1 F (36.7 C) Oral 81 17 100 % 5\' 6"  (1.676 m) 74.5 kg  10/22/21 0300 104/67 98.7 F (37.1 C) Oral 71 18 100 % -- --  10/22/21 0230 100/65 -- -- 79 18 100 % -- --  10/22/21 0109 (!) 112/57 98.9 F (37.2 C) Oral 68 20 100 % -- --  10/21/21 2333 -- -- -- -- -- 100 % -- --  10/21/21 2331 122/77 98.2 F (36.8 C) Oral 83 20 100 % 5\' 6"  (1.676 m) 73.5 kg   Results for orders placed or performed during the hospital encounter of 10/22/21 (from the past 24 hour(s))  Lipase, blood     Status: None   Collection Time: 10/21/21 11:40 PM  Result Value Ref Range   Lipase 12 11 - 51 U/L  Comprehensive metabolic panel     Status: Abnormal   Collection Time: 10/21/21 11:40 PM  Result Value Ref Range   Sodium 133 (L) 135 - 145 mmol/L   Potassium 3.8 3.5 - 5.1 mmol/L   Chloride 103 98 - 111 mmol/L   CO2 18 (L) 22 - 32 mmol/L   Glucose, Bld 102 (H) 70 - 99  mg/dL   BUN 9 6 - 20 mg/dL   Creatinine, Ser 10/23/21 0.44 - 1.00 mg/dL   Calcium 9.7 8.9 - 10/23/21 mg/dL   Total Protein 7.6 6.5 - 8.1 g/dL   Albumin 4.4 3.5 - 5.0 g/dL   AST 15 15 - 41 U/L   ALT 22 0 - 44 U/L   Alkaline Phosphatase 70 38 - 126 U/L   Total Bilirubin 0.5 0.3 - 1.2 mg/dL   GFR, Estimated 1.09 32.3 mL/min   Anion gap 12 5 - 15  CBC     Status: Abnormal   Collection Time: 10/21/21 11:40 PM  Result Value Ref Range   WBC 16.2 (H) 4.0 - 10.5 K/uL   RBC 4.59 3.87 - 5.11 MIL/uL   Hemoglobin 13.7 12.0 - 15.0 g/dL   HCT >73 10/23/21 - 22.0 %   MCV 83.7 80.0 - 100.0 fL   MCH 29.8 26.0 - 34.0 pg   MCHC 35.7 30.0 - 36.0 g/dL   RDW 25.4 27.0 - 62.3 %   Platelets 297 150 - 400 K/uL   nRBC 0.0 0.0 - 0.2 %  Urinalysis, Routine w reflex microscopic     Status: Abnormal   Collection Time: 10/21/21 11:40 PM  Result Value Ref Range   Color, Urine YELLOW YELLOW   APPearance HAZY (A) CLEAR   Specific Gravity, Urine 1.010 1.005 - 1.030   pH 7.5 5.0 - 8.0   Glucose, UA NEGATIVE NEGATIVE mg/dL   Hgb urine dipstick NEGATIVE NEGATIVE   Bilirubin Urine NEGATIVE NEGATIVE   Ketones, ur NEGATIVE NEGATIVE mg/dL   Protein, ur NEGATIVE NEGATIVE mg/dL   Nitrite NEGATIVE NEGATIVE   Leukocytes,Ua NEGATIVE NEGATIVE   RBC / HPF 0-5 0 - 5 RBC/hpf   WBC, UA 0-5 0 - 5 WBC/hpf   Bacteria, UA RARE (A) NONE SEEN   Squamous Epithelial / LPF 0-5 0 - 5   Mucus PRESENT    Hyaline Casts, UA PRESENT   Pregnancy, urine     Status: Abnormal   Collection Time: 10/21/21 11:40 PM  Result Value Ref Range   Preg Test, Ur POSITIVE (A) NEGATIVE  hCG, quantitative, pregnancy     Status: Abnormal  Collection Time: 10/21/21 11:40 PM  Result Value Ref Range   hCG, Beta Chain, Quant, S 57,251 (H) <5 mIU/mL   US OB LESS THAN 14 WEEKS WITH OB TRANSVAGINAL  Result Date: 10/22/2021 CLINICAL DATA:  26 year old pregnant female with history of lower abdominal pain. EXAM: OBSTETRIC <14 WK [place] AND TRANSVAGINAL OB US TECHNIQUE:  Both transabdominal and transvaginal ultrasound examinations were performed for complete evaluation of the gestation as well as the maternal uterus, adnexal regions, and pelvic cul-de-sac. Transvaginal technique was performed to assess early pregnancy. COMPARISON:  No priors. FINDINGS: Intrauterine gestational sac: Single Yolk sac:  Present Embryo:  Present Cardiac Activity: Present Heart Rate: 118 bpm CRL:  59.2 mm   6 w   3 d                  [place] EDC: 06/14/2022. Subchorionic hemorrhage:  None visualized. Maternal uterus/adnexae: Uterus and ovaries are otherwise unremarkable in appearance bilaterally. No free fluid in the cul-de-sac. IMPRESSION: 1. Single viable IUP with estimated gestational age of [redacted] weeks and 3 days and normal fetal heart rate of 118 beats per minute. No acute findings are noted. Electronically Signed   By: Trudie Reed M.D.   On: 10/22/2021 05:23    Meds ordered this encounter  Medications   promethazine (PHENERGAN) 12.5 MG tablet    Sig: Take 1 tablet (12.5 mg total) by mouth every 6 (six) hours as needed for nausea or vomiting.    Dispense:  30 tablet    Refill:  0    Order Specific Question:   Supervising Provider    Answer:   Lazaro Arms [[id number]]   Assessment and Plan  --26 y.o. G1P0 with confirmed live IUP c/w dates --Vomiting without ketonuria --New outpatient regimen for management of emesis --Encouraged grazing throughout the day, solids before sips of liquid --Discharge home in stable condition  Calvert Cantor, MSA, MSN, CNM 10/22/2021, 5:46 AM

## 2021-10-22 NOTE — Discharge Instructions (Signed)
Safe Medications in Pregnancy   Acne: Benzoyl Peroxide Salicylic Acid  Backache/Headache: Tylenol: 2 regular strength every 4 hours OR              2 Extra strength every 6 hours  Colds/Coughs/Allergies: Benadryl (alcohol free) 25 mg every 6 hours as needed Breath right strips Claritin Cepacol throat lozenges Chloraseptic throat spray Cold-Eeze- up to three times per day Cough drops, alcohol free Flonase (by prescription only) Guaifenesin Mucinex Robitussin DM (plain only, alcohol free) Saline nasal spray/drops Sudafed (pseudoephedrine) & Actifed ** use only after [redacted] weeks gestation and if you do not have high blood pressure Tylenol Vicks Vaporub Zinc lozenges Zyrtec   Constipation: Colace Ducolax suppositories Fleet enema Glycerin suppositories Metamucil Milk of magnesia Miralax Senokot Smooth move tea  Diarrhea: Kaopectate Imodium A-D  *NO pepto Bismol  Hemorrhoids: Anusol Anusol HC Preparation H Tucks  Indigestion: Tums Maalox Mylanta Zantac  Pepcid  Insomnia: Benadryl (alcohol free) 25mg  every 6 hours as needed Tylenol PM Unisom, no Gelcaps  Leg Cramps: Tums MagGel  Nausea/Vomiting:  Bonine Dramamine Emetrol Ginger extract Sea bands Meclizine  Nausea medication to take during pregnancy:  Unisom (doxylamine succinate 25 mg tablets) Take one tablet daily at bedtime. If symptoms are not adequately controlled, the dose can be increased to a maximum recommended dose of two tablets daily (1/2 tablet in the morning, 1/2 tablet mid-afternoon and one at bedtime). Vitamin B6 100mg  tablets. Take one tablet twice a day (up to 200 mg per day).  Skin Rashes: Aveeno products Benadryl cream or 25mg  every 6 hours as needed Calamine Lotion 1% cortisone cream  Yeast infection: Gyne-lotrimin 7 Monistat 7   **If taking multiple medications, please check labels to avoid duplicating the same active ingredients **take medication as directed on  the label ** Do not exceed 4000 mg of tylenol in 24 hours **Do not take medications that contain aspirin or ibuprofen   Vomiting in First Trimester Follow these instructions at home: To help relieve your symptoms, listen to your body. Everyone is different and has different preferences. Find what works best for you. Here are some things you can try to help relieve your symptoms: Meals and snacks Eat 5-6 small meals daily instead of 3 large meals. Eating small meals and snacks can help you avoid an empty stomach. Before getting out of bed, eat a couple of crackers to avoid moving around on an empty stomach. Eat a protein-rich snack before bed. Examples include cheese and crackers, or a peanut butter sandwich made with 1 slice of whole-wheat bread and 1 tsp (5 g) of peanut butter. Eat and drink slowly. Try eating starchy foods as these are usually tolerated well. Examples include cereal, toast, bread, potatoes, pasta, rice, and pretzels. Eat at least one serving of protein with your meals and snacks. Protein options include lean meats, poultry, seafood, beans, nuts, nut butters, eggs, cheese, and yogurt. Eat or suck on things that have ginger in them. It may help to relieve nausea. Add  tsp (0.44 g) ground ginger to hot tea, or choose ginger tea.   Fluids It is important to stay hydrated. Try to: Drink small amounts of fluids often. Drink fluids 30 minutes before or after a meal to help lessen the feeling of a full stomach. Drink 100% fruit juice or an electrolyte drink. An electrolyte drink contains sodium, potassium, and chloride. Drink fluids that are cold, clear, and carbonated or sour. These include lemonade, ginger ale, lemon-lime soda, ice water, and sparkling  water. Things to avoid Avoid the following: Eating foods that trigger your symptoms. These may include spicy foods, coffee, high-fat foods, very sweet foods, and acidic foods. Drinking more than 1 cup of fluid at a  time. Skipping meals. Nausea can be more intense on an empty stomach. If you cannot tolerate food, do not force it. Try sucking on ice chips or other frozen items and make up for missed calories later. Lying down within 2 hours after eating. Being exposed to environmental triggers. These may include food smells, smoky rooms, closed spaces, rooms with strong smells, warm or humid places, overly loud and noisy rooms, and rooms with motion or flickering lights. Try eating meals in a well-ventilated area that is free of strong smells. Making quick and sudden changes in your movement. Taking iron pills and multivitamins that contain iron. If you take prescription iron pills, do not stop taking them unless your health care provider approves. Preparing food. The smell of food can spoil your appetite or trigger nausea. General instructions Brush your teeth or use a mouth rinse after meals. Take over-the-counter and prescription medicines only as told by your health care provider. Follow instructions from your health care provider about eating or drinking restrictions. Talk with your health care provider about starting a supplement of vitamin B6. Continue to take your prenatal vitamins as told by your health care provider. If you are having trouble taking your prenatal vitamins, talk with your health care provider about other options. Keep all follow-up visits. This is important. Follow-up visits include prenatal visits. Contact a health care provider if: You have pain in your abdomen. You have a severe headache. You have vision problems. You are losing weight. You feel weak or dizzy. You cannot eat or drink without vomiting, especially if this goes on for a full day. Get help right away if: You cannot drink fluids without vomiting. You vomit blood. You have constant nausea and vomiting. You are very weak. You faint. You have a fever and your symptoms suddenly get worse. Summary Making some  changes to your eating habits may help relieve nausea and vomiting. This condition may be managed with lifestyle changes and medicines as prescribed by your health care provider. If medicines do not help relieve nausea and vomiting, you may need to receive fluids through an IV at the hospital. This information is not intended to replace advice given to you by your health care provider. Make sure you discuss any questions you have with your health care provider. Document Revised: 08/10/2019 Document Reviewed: 08/10/2019 Elsevier Patient Education  2021 [employer].

## 2021-10-26 DIAGNOSIS — Z7689 Persons encountering health services in other specified circumstances: Secondary | ICD-10-CM | POA: Diagnosis not present

## 2021-10-26 DIAGNOSIS — O219 Vomiting of pregnancy, unspecified: Secondary | ICD-10-CM | POA: Diagnosis not present

## 2021-10-28 ENCOUNTER — Inpatient Hospital Stay (HOSPITAL_COMMUNITY)
Admission: AD | Admit: 2021-10-28 | Discharge: 2021-10-28 | Disposition: A | Discharge status: Home / Self Care | Payer: BC Managed Care – PPO | Attending: Obstetrics and Gynecology | Admitting: Obstetrics and Gynecology

## 2021-10-28 ENCOUNTER — Encounter (HOSPITAL_COMMUNITY): Payer: Self-pay | Admitting: Obstetrics and Gynecology

## 2021-10-28 DIAGNOSIS — Z3A01 Less than 8 weeks gestation of pregnancy: Secondary | ICD-10-CM | POA: Diagnosis not present

## 2021-10-28 DIAGNOSIS — O210 Mild hyperemesis gravidarum: Secondary | ICD-10-CM | POA: Insufficient documentation

## 2021-10-28 DIAGNOSIS — E860 Dehydration: Secondary | ICD-10-CM | POA: Insufficient documentation

## 2021-10-28 DIAGNOSIS — O99281 Endocrine, nutritional and metabolic diseases complicating pregnancy, first trimester: Secondary | ICD-10-CM | POA: Diagnosis not present

## 2021-10-28 LAB — URINALYSIS, ROUTINE W REFLEX MICROSCOPIC
Bilirubin Urine: NEGATIVE
Glucose, UA: NEGATIVE mg/dL
Hgb urine dipstick: NEGATIVE
Ketones, ur: NEGATIVE mg/dL
Leukocytes,Ua: NEGATIVE
Nitrite: NEGATIVE
Protein, ur: NEGATIVE mg/dL
Specific Gravity, Urine: 1.019 (ref 1.005–1.030)
pH: 8 (ref 5.0–8.0)

## 2021-10-28 LAB — BASIC METABOLIC PANEL
Anion gap: 11 (ref 5–15)
BUN: 8 mg/dL (ref 6–20)
CO2: 21 mmol/L — ABNORMAL LOW (ref 22–32)
Calcium: 9.3 mg/dL (ref 8.9–10.3)
Chloride: 104 mmol/L (ref 98–111)
Creatinine, Ser: 0.61 mg/dL (ref 0.44–1.00)
GFR, Estimated: >60 mL/min (ref >60)
Glucose, Bld: 82 mg/dL (ref 70–99)
Potassium: 3.7 mmol/L (ref 3.5–5.1)
Sodium: 136 mmol/L (ref 135–145)

## 2021-10-28 LAB — CBC
HCT: 37.3 % (ref 36.0–46.0)
Hemoglobin: 13.2 g/dL (ref 12.0–15.0)
MCH: 30.3 pg (ref 26.0–34.0)
MCHC: 35.4 g/dL (ref 30.0–36.0)
MCV: 85.6 fL (ref 80.0–100.0)
Platelets: 277 10*3/uL (ref 150–400)
RBC: 4.36 MIL/uL (ref 3.87–5.11)
RDW: 12.6 % (ref 11.5–15.5)
WBC: 13.8 10*3/uL — ABNORMAL HIGH (ref 4.0–10.5)
nRBC: 0 % (ref 0.0–0.2)

## 2021-10-28 MED ORDER — SCOPOLAMINE 1 MG/3DAYS TD PT72: 1.0000 | MEDICATED_PATCH | TRANSDERMAL | 1 refills | Status: DC

## 2021-10-28 MED ORDER — FAMOTIDINE 20 MG PO TABS: 20.0000 mg | ORAL_TABLET | Freq: Every day | ORAL | 1 refills | Status: AC

## 2021-10-28 MED ORDER — FAMOTIDINE IN NACL 20-0.9 MG/50ML-% IV SOLN
20.0000 mg | Freq: Once | INTRAVENOUS | Status: AC
  Administered 2021-10-28: 20 mg via INTRAVENOUS
  Filled 2021-10-28: qty 50

## 2021-10-28 MED ORDER — METOCLOPRAMIDE HCL 5 MG/ML IJ SOLN
10.0000 mg | Freq: Once | INTRAMUSCULAR | Status: AC
  Administered 2021-10-28: 10 mg via INTRAVENOUS
  Filled 2021-10-28: qty 2

## 2021-10-28 MED ORDER — METOCLOPRAMIDE HCL 10 MG PO TABS: 10.0000 mg | ORAL_TABLET | Freq: Four times a day (QID) | ORAL | 1 refills | Status: DC | PRN

## 2021-10-28 MED ORDER — LACTATED RINGERS IV BOLUS
1000.0000 mL | Freq: Once | INTRAVENOUS | Status: AC
  Administered 2021-10-28: 1000 mL via INTRAVENOUS

## 2021-10-28 MED ORDER — SCOPOLAMINE 1 MG/3DAYS TD PT72
1.0000 | MEDICATED_PATCH | TRANSDERMAL | Status: DC
  Administered 2021-10-28: 1.5 mg via TRANSDERMAL
  Filled 2021-10-28: qty 1

## 2021-10-28 NOTE — MAU Provider Note (Signed)
History     CSN: [id number]  Arrival date and time: 10/28/21 1409   Event Date/Time   First Provider Initiated Contact with Patient 10/28/21 1526      Chief Complaint  Patient presents with   Emesis   Dizziness   Abdominal Pain   26 y.o. G1 @[redacted]w[redacted]d  with IUP presenting with ongoing N/V. Reports worsening sx in the last 2 days. Reports several episodes yesterday and 4 episodes today. Denies fever but reports chills at night. Denies abdominal pain but stomach feels sensitive. She has tried Phenergan but it hasn't helped.    OB History     Gravida  1   Para      Term      Preterm      AB      Living         SAB      IAB      Ectopic      Multiple      Live Births              Past Medical History:  Diagnosis Date   Hypertension 2022    Past Surgical History:  Procedure Laterality Date   INCISION AND DRAINAGE PERITONSILLAR ABSCESS Right 12/2019    Family History  Problem Relation Age of Onset   Diabetes Father     Social History   Tobacco Use   Smoking status: Never   Smokeless tobacco: Never  Vaping Use   Vaping Use: Never used  Substance Use Topics   Alcohol use: Yes   Drug use: Never    Allergies: No Known Allergies  Medications Prior to Admission  Medication Sig Dispense Refill Last Dose   nitrofurantoin, macrocrystal-monohydrate, (MACROBID) 100 MG capsule Take 1 capsule (100 mg total) by mouth 2 (two) times daily. X 7 days 14 capsule 0 Past Week   promethazine (PHENERGAN) 12.5 MG tablet Take 1 tablet (12.5 mg total) by mouth every 6 (six) hours as needed for nausea or vomiting. 30 tablet 0 10/28/2021 at 0900    Review of Systems  Constitutional:  Positive for chills. Negative for fever.  Gastrointestinal:  Positive for nausea and vomiting. Negative for abdominal pain and diarrhea.  Genitourinary:  Negative for vaginal bleeding.   Physical Exam   Blood pressure 102/67, pulse 82, temperature 98.2 F (36.8 C), temperature source  Oral, resp. rate 16, height 5\' 6"  (1.676 m), weight 74.4 kg, last menstrual period 09/05/2021, SpO2 100 %.  Physical Exam Vitals and nursing note reviewed.  Constitutional:      General: She is not in acute distress.    Appearance: Normal appearance.  HENT:     Head: Normocephalic and atraumatic.  Cardiovascular:     Rate and Rhythm: Normal rate.  Pulmonary:     Effort: Pulmonary effort is normal. No respiratory distress.  Abdominal:     General: There is no distension.     Palpations: Abdomen is soft. There is no mass.     Tenderness: There is no abdominal tenderness. There is no guarding or rebound.     Hernia: No hernia is present.  Musculoskeletal:        General: Normal range of motion.     Cervical back: Normal range of motion.  Skin:    General: Skin is warm and dry.     Comments: Lips cracked  Neurological:     General: No focal deficit present.     Mental Status: She is alert and oriented to  person, place, and time.  Psychiatric:        Mood and Affect: Mood normal.        Behavior: Behavior normal.    Results for orders placed or performed during the hospital encounter of 10/28/21 (from the past 24 hour(s))  Urinalysis, Routine w reflex microscopic Urine, Clean Catch     Status: Abnormal   Collection Time: 10/28/21  2:35 PM  Result Value Ref Range   Color, Urine YELLOW YELLOW   APPearance TURBID (A) CLEAR   Specific Gravity, Urine 1.019 1.005 - 1.030   pH 8.0 5.0 - 8.0   Glucose, UA NEGATIVE NEGATIVE mg/dL   Hgb urine dipstick NEGATIVE NEGATIVE   Bilirubin Urine NEGATIVE NEGATIVE   Ketones, ur NEGATIVE NEGATIVE mg/dL   Protein, ur NEGATIVE NEGATIVE mg/dL   Nitrite NEGATIVE NEGATIVE   Leukocytes,Ua NEGATIVE NEGATIVE   RBC / HPF 6-10 0 - 5 RBC/hpf   WBC, UA 21-50 0 - 5 WBC/hpf   Bacteria, UA RARE (A) NONE SEEN   Squamous Epithelial / LPF 11-20 0 - 5   WBC Clumps PRESENT    Mucus PRESENT   CBC     Status: Abnormal   Collection Time: 10/28/21  5:34 PM   Result Value Ref Range   WBC 13.8 (H) 4.0 - 10.5 K/uL   RBC 4.36 3.87 - 5.11 MIL/uL   Hemoglobin 13.2 12.0 - 15.0 g/dL   HCT 37.3 36.0 - 46.0 %   MCV 85.6 80.0 - 100.0 fL   MCH 30.3 26.0 - 34.0 pg   MCHC 35.4 30.0 - 36.0 g/dL   RDW 12.6 11.5 - 15.5 %   Platelets 277 150 - 400 K/uL   nRBC 0.0 0.0 - 0.2 %  Basic metabolic panel     Status: Abnormal   Collection Time: 10/28/21  5:34 PM  Result Value Ref Range   Sodium 136 135 - 145 mmol/L   Potassium 3.7 3.5 - 5.1 mmol/L   Chloride 104 98 - 111 mmol/L   CO2 21 (L) 22 - 32 mmol/L   Glucose, Bld 82 70 - 99 mg/dL   BUN 8 6 - 20 mg/dL   Creatinine, Ser 0.61 0.44 - 1.00 mg/dL   Calcium 9.3 8.9 - 10.3 mg/dL   GFR, Estimated >60 >60 mL/min   Anion gap 11 5 - 15   MAU Course  Procedures Scopolamine LR Reglan Pepcid  MDM Labs ordered and reviewed. Feeling better. No further emesis. Tolerating po fluids and crackers. Stable for discharge home.  Assessment and Plan   1. [redacted] weeks gestation of pregnancy   2. Dehydration   3. Morning sickness    Discharge home Follow up with OB as scheduled Rx Reglan Rx Scop Rx Pepcid Return precautions  Allergies as of 10/28/2021   No Known Allergies      Medication List     STOP taking these medications    nitrofurantoin (macrocrystal-monohydrate) 100 MG capsule Commonly known as: Macrobid   promethazine 12.5 MG tablet Commonly known as: PHENERGAN       TAKE these medications    famotidine 20 MG tablet Commonly known as: PEPCID Take 1 tablet (20 mg total) by mouth at bedtime.   metoCLOPramide 10 MG tablet Commonly known as: Reglan Take 1 tablet (10 mg total) by mouth every 6 (six) hours as needed for nausea or vomiting.   scopolamine 1 MG/3DAYS Commonly known as: TRANSDERM-SCOP Place 1 patch (1.5 mg total) onto the skin every 3 (three)  days. Start taking on: October 31, 2021        Donette Larry, [place] 10/28/2021, 6:46 PM

## 2021-10-28 NOTE — MAU Note (Signed)
...  Stacy Black is a 26 y.o. at [redacted]w[redacted]d here in MAU reporting: 4 episodes of emesis today as well as constant nausea. She reports she is feeling weak and dizzy and it is hard for her to stand without needing to sit down. She states her medications are not working. Denies urinary urgency and burning with urination. Denies VB.   Last doses: Phenergan this morning at 0930  She reports she has only taken two doses of the previously prescribed Macrobid as it upsets her stomach.  Onset of complaint: x1 week Pain score: 6/10 upper abdominal  Lab orders placed from triage:  UA

## 2021-10-29 LAB — CULTURE, OB URINE: Culture: <10000 — AB

## 2021-11-01 ENCOUNTER — Emergency Department (HOSPITAL_COMMUNITY): Payer: BC Managed Care – PPO

## 2021-11-01 ENCOUNTER — Other Ambulatory Visit: Payer: Self-pay

## 2021-11-01 ENCOUNTER — Emergency Department (HOSPITAL_BASED_OUTPATIENT_CLINIC_OR_DEPARTMENT_OTHER)
Admission: EM | Admit: 2021-11-01 | Discharge: 2021-11-02 | Discharge status: Against Medical Advice | Payer: BC Managed Care – PPO | Attending: Emergency Medicine | Admitting: Emergency Medicine

## 2021-11-01 ENCOUNTER — Encounter (HOSPITAL_BASED_OUTPATIENT_CLINIC_OR_DEPARTMENT_OTHER): Payer: Self-pay

## 2021-11-01 ENCOUNTER — Encounter (HOSPITAL_COMMUNITY): Payer: Self-pay

## 2021-11-01 DIAGNOSIS — R42 Dizziness and giddiness: Secondary | ICD-10-CM | POA: Diagnosis not present

## 2021-11-01 DIAGNOSIS — R519 Headache, unspecified: Secondary | ICD-10-CM | POA: Insufficient documentation

## 2021-11-01 DIAGNOSIS — H539 Unspecified visual disturbance: Secondary | ICD-10-CM

## 2021-11-01 DIAGNOSIS — Z3A08 8 weeks gestation of pregnancy: Secondary | ICD-10-CM | POA: Insufficient documentation

## 2021-11-01 DIAGNOSIS — Z3A01 Less than 8 weeks gestation of pregnancy: Secondary | ICD-10-CM | POA: Diagnosis not present

## 2021-11-01 DIAGNOSIS — O26891 Other specified pregnancy related conditions, first trimester: Secondary | ICD-10-CM | POA: Diagnosis not present

## 2021-11-01 DIAGNOSIS — I10 Essential (primary) hypertension: Secondary | ICD-10-CM | POA: Insufficient documentation

## 2021-11-01 DIAGNOSIS — Z5321 Procedure and treatment not carried out due to patient leaving prior to being seen by health care provider: Secondary | ICD-10-CM | POA: Diagnosis not present

## 2021-11-01 LAB — COMPREHENSIVE METABOLIC PANEL
ALT: 19 U/L (ref 0–44)
AST: 13 U/L — ABNORMAL LOW (ref 15–41)
Albumin: 4.4 g/dL (ref 3.5–5.0)
Alkaline Phosphatase: 69 U/L (ref 38–126)
Anion gap: 11 (ref 5–15)
BUN: 8 mg/dL (ref 6–20)
CO2: 18 mmol/L — ABNORMAL LOW (ref 22–32)
Calcium: 9.4 mg/dL (ref 8.9–10.3)
Chloride: 103 mmol/L (ref 98–111)
Creatinine, Ser: 0.57 mg/dL (ref 0.44–1.00)
GFR, Estimated: >60 mL/min (ref >60)
Glucose, Bld: 98 mg/dL (ref 70–99)
Potassium: 3.6 mmol/L (ref 3.5–5.1)
Sodium: 132 mmol/L — ABNORMAL LOW (ref 135–145)
Total Bilirubin: 0.4 mg/dL (ref 0.3–1.2)
Total Protein: 7.4 g/dL (ref 6.5–8.1)

## 2021-11-01 LAB — URINALYSIS, ROUTINE W REFLEX MICROSCOPIC
Bilirubin Urine: NEGATIVE
Glucose, UA: NEGATIVE mg/dL
Hgb urine dipstick: NEGATIVE
Ketones, ur: NEGATIVE mg/dL
Leukocytes,Ua: NEGATIVE
Nitrite: NEGATIVE
Protein, ur: NEGATIVE mg/dL
Specific Gravity, Urine: 1.02 (ref 1.005–1.030)
pH: 6.5 (ref 5.0–8.0)

## 2021-11-01 LAB — CBC WITH DIFFERENTIAL/PLATELET
Abs Immature Granulocytes: 0.1 10*3/uL — ABNORMAL HIGH (ref 0.00–0.07)
Basophils Absolute: 0 10*3/uL (ref 0.0–0.1)
Basophils Relative: 0 %
Eosinophils Absolute: 0.2 10*3/uL (ref 0.0–0.5)
Eosinophils Relative: 1 %
HCT: 36.7 % (ref 36.0–46.0)
Hemoglobin: 13.2 g/dL (ref 12.0–15.0)
Immature Granulocytes: 1 %
Lymphocytes Relative: 17 %
Lymphs Abs: 2.3 10*3/uL (ref 0.7–4.0)
MCH: 29.9 pg (ref 26.0–34.0)
MCHC: 36 g/dL (ref 30.0–36.0)
MCV: 83.2 fL (ref 80.0–100.0)
Monocytes Absolute: 0.9 10*3/uL (ref 0.1–1.0)
Monocytes Relative: 6 %
Neutro Abs: 10.3 10*3/uL — ABNORMAL HIGH (ref 1.7–7.7)
Neutrophils Relative %: 75 %
Platelets: 299 10*3/uL (ref 150–400)
RBC: 4.41 MIL/uL (ref 3.87–5.11)
RDW: 12.8 % (ref 11.5–15.5)
WBC: 13.9 10*3/uL — ABNORMAL HIGH (ref 4.0–10.5)
nRBC: 0 % (ref 0.0–0.2)

## 2021-11-01 NOTE — ED Triage Notes (Addendum)
Pt states she is [redacted] weeks pregnant. Pt states she has been dizzy and is having issues with her near vision, even with glasses on. Husband states pt has had disorientation.  First OB appt 10/13

## 2021-11-01 NOTE — MAU Note (Signed)
MAU provider Hansel Feinstein, CNM was called by ED provider and told that pt was sent over to MAU accidentally.   This RN called ED charge nurse, and accepted pt This RN wheeled pt to ED registration.

## 2021-11-01 NOTE — ED Notes (Signed)
Report given to charge RN at [facility] ED.

## 2021-11-01 NOTE — MAU Provider Note (Signed)
Got a call from Unit Secretary at [facility] ED that this patient who was seen at [facility] ED was sent to [facility] ED, but she is [redacted] weeks pregnant so secretary is having her brought over here to MAU  It is clear from ED note that Dr Oswald Hillock has already accepted her for Brain MRI into [facility] ED.  Our Charge Nurse confirmed this with [facility] ED Charge Nurse who instructed her to return patient to [facility] ED.   Patient arrived by private car.  Seabron Spates, CNM

## 2021-11-01 NOTE — ED Notes (Signed)
[facility] being taken [facility]

## 2021-11-01 NOTE — ED Provider Notes (Signed)
[facility] EMERGENCY DEPT Provider Note   CSN: [id number] Arrival date & time: 11/01/21  1627     History  Chief Complaint  Patient presents with   Dizziness    Stacy Black is a 26 y.o. female.   Dizziness    Patient with previous history of hypertension, roughly [redacted] weeks gestational age 3 confirmed as of 10/22/2021 presents today due to dizziness, headache, blurry vision to both eyes and "not acting right/acting disoriented".    Patient symptoms started yesterday morning when she woke up.  She is normally nearsighted with glasses, even with her glasses on she is having blurry vision now of close to her face as well as far away.  Is actually worse when she is wearing her corrective lenses.  She has pain when she moves her eyes in both sides, also having double vision in both eyes.  She has a headache which comes and goes as well as right-sided neck pain.  She is able to walk but feels unsteady on her feet, at rest and with walking she feels like she is going to pass out or like she is unstable.  Minute this is never happened before, no history of neurologic disease.  She denies any abdominal pain or vaginal bleeding.  No recent head trauma or injuries.  Patient was previously on scopolamine patches, stopped taking it yesterday secondary to symptoms.  She takes Reglan for nausea and vomiting, no other medicine daily.  Of note, patient is not from [place] and does not speak [place].  Perfectly fluent in [place].  Home Medications Prior to Admission medications   Medication Sig Start Date End Date Taking? Authorizing Provider  famotidine (PEPCID) 20 MG tablet Take 1 tablet (20 mg total) by mouth at bedtime. 10/28/21   Julianne Handler, CNM  metoCLOPramide (REGLAN) 10 MG tablet Take 1 tablet (10 mg total) by mouth every 6 (six) hours as needed for nausea or vomiting. 10/28/21   Julianne Handler, CNM  scopolamine (TRANSDERM-SCOP) 1 MG/3DAYS Place 1 patch (1.5 mg  total) onto the skin every 3 (three) days. 10/31/21   Julianne Handler, CNM      Allergies    Patient has no known allergies.    Review of Systems   Review of Systems  Neurological:  Positive for dizziness.    Physical Exam Updated Vital Signs BP 109/74 (BP Location: Right Arm)   Pulse 75   Temp 98.1 F (36.7 C) (Oral)   Resp 16   Ht 5\' 6"  (1.676 m)   Wt 74.4 kg   LMP 09/05/2021   SpO2 100%   BMI 26.47 kg/m  Physical Exam Vitals and nursing note reviewed. Exam conducted with a chaperone present.  Constitutional:      Appearance: Normal appearance.  HENT:     Head: Normocephalic and atraumatic.     Mouth/Throat:     Comments: Right TMJ pain Eyes:     General: No scleral icterus.       Right eye: No discharge.        Left eye: No discharge.     Extraocular Movements: Extraocular movements intact.     Pupils: Pupils are equal, round, and reactive to light.     Comments: Pain with EOM but EOMs are roughly intact without nystagmus.  Peripheral fields are roughly intact confrontation, diplopia bilaterally per patient.  No change in red saturation to either eye.  Cardiovascular:     Rate and Rhythm: Normal rate and regular rhythm.  Pulses: Normal pulses.     Heart sounds: Normal heart sounds. No murmur heard.    No friction rub. No gallop.  Pulmonary:     Effort: Pulmonary effort is normal. No respiratory distress.     Breath sounds: Normal breath sounds.  Abdominal:     General: Abdomen is flat. Bowel sounds are normal. There is no distension.     Palpations: Abdomen is soft.     Tenderness: There is no abdominal tenderness.  Musculoskeletal:        General: No tenderness.  Skin:    General: Skin is warm and dry.     Coloration: Skin is not jaundiced.  Neurological:     Mental Status: She is alert. Mental status is at baseline.     Coordination: Coordination normal.     Comments: Cranial nerves II through XII are grossly intact, upper and lower extremity  strength is roughly symmetric.  No pronator drift, normal finger-nose.  Able to raise both lower extremities at difficulty, plantarflexion dorsiflexion 5/5 against resistance.  Oriented x3.     ED Results / Procedures / Treatments   Labs (all labs ordered are listed, but only abnormal results are displayed) Labs Reviewed  CBC WITH DIFFERENTIAL/PLATELET - Abnormal; Notable for the following components:      Result Value   WBC 13.9 (*)    Neutro Abs 10.3 (*)    Abs Immature Granulocytes 0.10 (*)    All other components within normal limits  COMPREHENSIVE METABOLIC PANEL - Abnormal; Notable for the following components:   Sodium 132 (*)    CO2 18 (*)    AST 13 (*)    All other components within normal limits  URINALYSIS, ROUTINE W REFLEX MICROSCOPIC    EKG None  Radiology No results found.  Procedures Procedures    Medications Ordered in ED Medications - No data to display  ED Course/ Medical Decision Making/ A&P                           Medical Decision Making Amount and/or Complexity of Data Reviewed Labs: ordered. Radiology: ordered.   Patient presents due to headache, dizziness, mental status changes, neck pain and blurry vision during eighth week of pregnancy.  Differential is broad and includes but not limited to central brain fibrosis, CVA, TIA, migraine.  Too early to be preeclampsia.  No abdominal pain or vaginal bleeding, IUP was also confirmed a few weeks ago.  Neuro exam is not particularly revealing, there are no appreciable focal deficits.  She does have subjective eye pain with movement.  Nontoxic-appearing, oriented x3.  History is provided by the patient and by husband.  I ordered and reviewed interpreted laboratory work-up.   -CBC with leukocytosis of 13.9 with left shift.   -CMP is without gross Electra arrangement or AKI.   -UA is unremarkable.    I did consult with neurology and spoke with Dr. Leonel Ramsay.  He advises MRI brain without  contrast, MR angio head and neck without contrast and MR venogram of the head.  If these are negative suggestive of migraine patient can follow-up with eye doctor regarding the vision changes.  Appreciate his consult.  I spoke with EDP Dr. Oswald Hillock who accepts patient.  Patient and husband were updated on plan, they will go by POV to [facility].  Plan-MR imaging, reassessment.  If negative work-up patient can follow-up outpatient with ophthalmology and primary.  Final Clinical Impression(s) / ED Diagnoses Final diagnoses:  None    Rx / DC Orders ED Discharge Orders     None         Sherrill Raring, Hershal Coria 11/01/21 2004    Ezequiel Essex, MD 11/02/21 1042

## 2021-11-01 NOTE — Discharge Instructions (Addendum)
Please go to [facility] for MR imaging. These orders have been placed - check in at front desk so radiology will know you are there to bring back for imaging.   [address] [place], [place] [address]

## 2021-11-02 NOTE — ED Notes (Signed)
Called pt;s name for vitals and got no response

## 2021-11-03 DIAGNOSIS — E860 Dehydration: Secondary | ICD-10-CM | POA: Diagnosis not present

## 2021-11-03 DIAGNOSIS — O210 Mild hyperemesis gravidarum: Secondary | ICD-10-CM | POA: Diagnosis not present

## 2021-11-10 DIAGNOSIS — N911 Secondary amenorrhea: Secondary | ICD-10-CM | POA: Diagnosis not present

## 2021-11-14 DIAGNOSIS — Z3401 Encounter for supervision of normal first pregnancy, first trimester: Secondary | ICD-10-CM | POA: Diagnosis not present

## 2021-11-14 DIAGNOSIS — Z3685 Encounter for antenatal screening for Streptococcus B: Secondary | ICD-10-CM | POA: Diagnosis not present

## 2021-11-14 DIAGNOSIS — Z3481 Encounter for supervision of other normal pregnancy, first trimester: Secondary | ICD-10-CM | POA: Diagnosis not present

## 2021-11-22 DIAGNOSIS — Z113 Encounter for screening for infections with a predominantly sexual mode of transmission: Secondary | ICD-10-CM | POA: Diagnosis not present

## 2021-11-23 ENCOUNTER — Other Ambulatory Visit: Payer: Self-pay

## 2021-11-23 DIAGNOSIS — O211 Hyperemesis gravidarum with metabolic disturbance: Secondary | ICD-10-CM | POA: Insufficient documentation

## 2022-01-02 DIAGNOSIS — Z1211 Encounter for screening for malignant neoplasm of colon: Secondary | ICD-10-CM | POA: Diagnosis not present

## 2022-01-02 DIAGNOSIS — Z23 Encounter for immunization: Secondary | ICD-10-CM | POA: Diagnosis not present

## 2022-01-02 DIAGNOSIS — Z0000 Encounter for general adult medical examination without abnormal findings: Secondary | ICD-10-CM | POA: Diagnosis not present

## 2022-01-15 DIAGNOSIS — Z363 Encounter for antenatal screening for malformations: Secondary | ICD-10-CM | POA: Diagnosis not present

## 2022-01-15 DIAGNOSIS — Z3A18 18 weeks gestation of pregnancy: Secondary | ICD-10-CM | POA: Diagnosis not present

## 2022-01-29 NOTE — L&D Delivery Note (Signed)
Operative Delivery Note At 7:26 PM a viable female was delivered via Vaginal, Vacuum Investment banker, operational).  Presentation: vertex; Position: ROT initially, rotated to ROA; Station: +1.  Patient pushing x 1hr, maternal exhaustion - asking for help with delivery. Fetal vertex noted to be ROT, manually rotated to ROA and consent was obtained as below. Vacuum was applied and 3 pulls resulted in delivery of the head without complications.  Verbal consent: obtained from patient.  Risks and benefits discussed in detail.  Risks include, but are not limited to the risks of anesthesia, bleeding, infection, damage to maternal tissues, fetal cephalhematoma.  There is also the risk of inability to effect vaginal delivery of the head, or shoulder dystocia that cannot be resolved by established maneuvers, leading to the need for emergency cesarean section.  APGAR: per nursing record; weight pending.   Placenta status: Spontaneous, intact Cord: 3VC with the following complications: none.  Anesthesia: Epidural Episiotomy: None Lacerations: 2nd degree, R labial Suture Repair: 2.0 Vicryl Est. Blood Loss (mL): 205ccs  Mom to postpartum.  Baby to Couplet care / Skin to Skin.  Tawni Levy 05/14/2022, 7:45 PM

## 2022-02-05 DIAGNOSIS — Z362 Encounter for other antenatal screening follow-up: Secondary | ICD-10-CM | POA: Diagnosis not present

## 2022-02-05 DIAGNOSIS — Z3A21 21 weeks gestation of pregnancy: Secondary | ICD-10-CM | POA: Diagnosis not present

## 2022-02-14 DIAGNOSIS — N760 Acute vaginitis: Secondary | ICD-10-CM | POA: Diagnosis not present

## 2022-03-09 DIAGNOSIS — Z3480 Encounter for supervision of other normal pregnancy, unspecified trimester: Secondary | ICD-10-CM | POA: Diagnosis not present

## 2022-03-09 DIAGNOSIS — Z23 Encounter for immunization: Secondary | ICD-10-CM | POA: Diagnosis not present

## 2022-04-20 ENCOUNTER — Observation Stay (HOSPITAL_COMMUNITY)
Admission: AD | Admit: 2022-04-20 | Discharge: 2022-04-21 | Disposition: A | Discharge status: Home / Self Care | Payer: BC Managed Care – PPO | Attending: Obstetrics and Gynecology | Admitting: Obstetrics and Gynecology

## 2022-04-20 ENCOUNTER — Encounter (HOSPITAL_COMMUNITY): Payer: Self-pay | Admitting: Obstetrics and Gynecology

## 2022-04-20 ENCOUNTER — Other Ambulatory Visit: Payer: Self-pay

## 2022-04-20 DIAGNOSIS — O24415 Gestational diabetes mellitus in pregnancy, controlled by oral hypoglycemic drugs: Principal | ICD-10-CM | POA: Insufficient documentation

## 2022-04-20 DIAGNOSIS — Z3A32 32 weeks gestation of pregnancy: Secondary | ICD-10-CM | POA: Insufficient documentation

## 2022-04-20 DIAGNOSIS — Z7984 Long term (current) use of oral hypoglycemic drugs: Secondary | ICD-10-CM | POA: Diagnosis not present

## 2022-04-20 DIAGNOSIS — Z3490 Encounter for supervision of normal pregnancy, unspecified, unspecified trimester: Secondary | ICD-10-CM

## 2022-04-20 DIAGNOSIS — O24414 Gestational diabetes mellitus in pregnancy, insulin controlled: Secondary | ICD-10-CM | POA: Diagnosis not present

## 2022-04-20 LAB — TYPE AND SCREEN
ABO/RH(D): B POS
Antibody Screen: NEGATIVE

## 2022-04-20 LAB — GLUCOSE, CAPILLARY
Glucose-Capillary: 123 mg/dL — ABNORMAL HIGH (ref 70–99)
Glucose-Capillary: 164 mg/dL — ABNORMAL HIGH (ref 70–99)
Glucose-Capillary: 110 mg/dL — ABNORMAL HIGH (ref 70–99)

## 2022-04-20 MED ORDER — ONDANSETRON 4 MG PO TBDP
4.0000 mg | ORAL_TABLET | Freq: Three times a day (TID) | ORAL | Status: DC | PRN
  Administered 2022-04-20: 4 mg via ORAL
  Filled 2022-04-20: qty 1

## 2022-04-20 MED ORDER — CALCIUM CARBONATE ANTACID 500 MG PO CHEW: 2.0000 | CHEWABLE_TABLET | ORAL | Status: DC | PRN

## 2022-04-20 MED ORDER — FAMOTIDINE 20 MG PO TABS
40.0000 mg | ORAL_TABLET | Freq: Every evening | ORAL | Status: DC | PRN
  Administered 2022-04-20: 40 mg via ORAL
  Filled 2022-04-20: qty 2

## 2022-04-20 MED ORDER — LACTATED RINGERS IV SOLN: 125.0000 mL/h | INTRAVENOUS | Status: DC

## 2022-04-20 MED ORDER — INSULIN ASPART 100 UNIT/ML IJ SOLN
0.0000 [IU] | Freq: Three times a day (TID) | INTRAMUSCULAR | Status: DC
  Administered 2022-04-20: 3 [IU] via SUBCUTANEOUS
  Administered 2022-04-21: 1 [IU] via SUBCUTANEOUS

## 2022-04-20 MED ORDER — PRENATAL MULTIVITAMIN CH
1.0000 | ORAL_TABLET | Freq: Every day | ORAL | Status: DC
  Filled 2022-04-20: qty 1

## 2022-04-20 MED ORDER — ACETAMINOPHEN 325 MG PO TABS
650.0000 mg | ORAL_TABLET | ORAL | Status: DC | PRN
  Administered 2022-04-20: 650 mg via ORAL
  Filled 2022-04-20: qty 2

## 2022-04-20 MED ORDER — FAMOTIDINE 20 MG PO TABS: 20.0000 mg | ORAL_TABLET | Freq: Two times a day (BID) | ORAL | Status: DC | PRN

## 2022-04-20 MED ORDER — DOCUSATE SODIUM 100 MG PO CAPS
100.0000 mg | ORAL_CAPSULE | Freq: Every day | ORAL | Status: DC
  Administered 2022-04-21: 100 mg via ORAL
  Filled 2022-04-20: qty 1

## 2022-04-20 NOTE — MAU Note (Signed)
.  Stacy Black is a 27 y.o. at [redacted]w[redacted]d here in MAU reporting: Sent over from office for admission to monitor blood glucose levels. She reports her last cbg at home was 111 earlier this morning prior to breakfast. Endorses a constant tight pain in her abdomen that has been ongoing throughout her entire third trimester. Endorses nausea and vomiting throughout her pregnancy.  Last took Glyburide yesterday.  Onset of complaint:  Pain score: Denies pain.  FHT: 152 initial external

## 2022-04-20 NOTE — H&P (Signed)
Stacy Black is a 27 y.o. female presenting for management of blood sugars. She was recently diagnosed with A2GDM and placed on glyburde 2.5mg  BID. Despite that, BS remain elevated. Postprandials as high as 200s. FBS today 111.  [place] in the office today demonstrated an EFW= 2071g (4'9oz)= 54 %tile. Early pregnancy c/b hyperemesis which has resolved.    OB History     Gravida  1   Para      Term      Preterm      AB      Living         SAB      IAB      Ectopic      Multiple      Live Births             Past Medical History:  Diagnosis Date   Hypertension 2022   Past Surgical History:  Procedure Laterality Date   INCISION AND DRAINAGE PERITONSILLAR ABSCESS Right 12/2019   Family History: family history includes Diabetes in her father. Social History:  reports that she has never smoked. She has never used smokeless tobacco. She reports that she does not currently use alcohol. She reports that she does not use drugs.     Maternal Diabetes: Yes:  Diabetes Type:  Insulin/Medication controlled Genetic Screening: Normal Maternal Ultrasounds/Referrals: Normal Fetal Ultrasounds or other Referrals:  None Maternal Substance Abuse:  No Significant Maternal Medications:  None Significant Maternal Lab Results:  None Number of Prenatal Visits:greater than 3 verified prenatal visits Other Comments:  None  Review of Systems History   Blood pressure 125/83, pulse 75, temperature 98.1 F (36.7 C), temperature source Oral, resp. rate 15, last menstrual period 09/05/2021, SpO2 100 %. Exam Physical Exam  NAD, A&O NWOB Abd soft, nondistended, gravid  Prenatal labs: ABO, Rh:   Antibody:   Rubella:   RPR:    HBsAg:    HIV:    GBS:     Assessment/Plan: 27 yo G1P0 presenting at 22.3 wga for inpatient titration of insulin in the setting of gestational diabetes.  DM diet. Stop glyburide. Transition to insulin pending DM coordinator recs. NST daily.    Colin Benton Edna Grover 04/20/2022, 2:02 PM

## 2022-04-21 DIAGNOSIS — O24414 Gestational diabetes mellitus in pregnancy, insulin controlled: Secondary | ICD-10-CM | POA: Diagnosis not present

## 2022-04-21 DIAGNOSIS — Z7984 Long term (current) use of oral hypoglycemic drugs: Secondary | ICD-10-CM | POA: Diagnosis not present

## 2022-04-21 DIAGNOSIS — O24415 Gestational diabetes mellitus in pregnancy, controlled by oral hypoglycemic drugs: Secondary | ICD-10-CM | POA: Diagnosis not present

## 2022-04-21 DIAGNOSIS — Z3A32 32 weeks gestation of pregnancy: Secondary | ICD-10-CM | POA: Diagnosis not present

## 2022-04-21 LAB — GLUCOSE, CAPILLARY
Glucose-Capillary: 93 mg/dL (ref 70–99)
Glucose-Capillary: 120 mg/dL — ABNORMAL HIGH (ref 70–99)

## 2022-04-21 MED ORDER — METFORMIN HCL ER (OSM) 1000 MG PO TB24: 1000.0000 mg | ORAL_TABLET | Freq: Two times a day (BID) | ORAL | 1 refills | Status: DC

## 2022-04-21 MED ORDER — METFORMIN HCL ER 500 MG PO TB24
1000.0000 mg | ORAL_TABLET | Freq: Two times a day (BID) | ORAL | Status: DC
  Administered 2022-04-21: 1000 mg via ORAL
  Filled 2022-04-21 (×2): qty 2

## 2022-04-21 MED ORDER — METFORMIN HCL ER 500 MG PO TB24
1000.0000 mg | ORAL_TABLET | Freq: Two times a day (BID) | ORAL | Status: DC
  Filled 2022-04-21: qty 2

## 2022-04-21 NOTE — Inpatient Diabetes Management (Signed)
Inpatient Diabetes Program Recommendations  Diabetes Treatment Program Recommendations  ADA Standards of Care Diabetes in Pregnancy Target Glucose Ranges:  Fasting: 70 - 95 mg/dL 1 hr postprandial: Less than 140mg /dL (from first bite of meal) 2 hr postprandial: Less than 120 mg/dL (from first bite of meal)    Lab Results  Component Value Date   GLUCAP 93 04/21/2022    Review of Glycemic Control  Latest Reference Range & Units 04/20/22 20:05 04/20/22 22:18 04/21/22 06:10  Glucose-Capillary 70 - 99 mg/dL 164 (H) 110 (H) 93  (H): Data is abnormally high Diabetes history: GDM Outpatient Diabetes medications: Glipizide 2.5 mg bid Current orders for Inpatient glycemic control: Novolog 0-14 unit TID  Inpatient Diabetes Program Recommendations:    Noted consult and that postprandials >200 mg/dL outpatient.  Currently, one elevation of 164 mg/dL; other close to goal.   Could consider metformin 1000 mg BID (if able to tolerate). Will continue to follow over weekend and make recommendations based on glucose elevations.   Thanks, Bronson Curb, MSN, RNC-OB Diabetes Coordinator [phone] (8a-5p)

## 2022-04-21 NOTE — Discharge Summary (Signed)
[facility] Discharge Summary      Patient Name: Stacy Black DOB: 1995-10-26 MRN: [id number]  Date of admission: 04/20/2022  Admitting diagnosis: Pregnancy [Z34.90] Intrauterine pregnancy: [redacted]w[redacted]d     Secondary diagnosis:  Principal Problem:   Pregnancy  Additional problems: Gestational DM    Discharge diagnosis:  gestational DM                                               Hospital course: 27 yo G1P0 presented at 34.3 wga for glycemic control. She self reported sugars at home in the low 200s. However, did not bring her blood sugar log. Since admission she has had fair control completely off of glyburide and has only required 4 units of insulin total. Decision made with DM coordinator to try Metformin 1000mg  BID instead of insulin. She was able to tolerate Metformin and requested to go home.  Will fu in the office in 2 days with her BS log. Hypoglycemia precautions were given.   Physical exam  Vitals:   04/20/22 2002 04/21/22 0612 04/21/22 0731 04/21/22 1144  BP: 129/76 106/61 117/75 133/83  Pulse: 66 79 (!) 57 76  Resp: 17 16 16 14   Temp: (!) 97.5 F (36.4 C) 98.2 F (36.8 C) 97.6 F (36.4 C) 98 F (36.7 C)  TempSrc: Oral Oral Oral Oral  SpO2: 100% 100% 99% 98%   General: alert Uterine Fundus: gravid, nontender DVT Evaluation: No evidence of DVT seen on physical exam.  Labs: Lab Results  Component Value Date   WBC 13.9 (H) 11/01/2021   HGB 13.2 11/01/2021   HCT 36.7 11/01/2021   MCV 83.2 11/01/2021   PLT 299 11/01/2021      Latest Ref Rng & Units 11/01/2021    4:41 PM  CMP  Glucose 70 - 99 mg/dL 98   BUN 6 - 20 mg/dL 8   Creatinine 0.44 - 1.00 mg/dL 0.57   Sodium 135 - 145 mmol/L 132   Potassium 3.5 - 5.1 mmol/L 3.6   Chloride 98 - 111 mmol/L 103   CO2 22 - 32 mmol/L 18   Calcium 8.9 - 10.3 mg/dL 9.4   Total Protein 6.5 - 8.1 g/dL 7.4   Total Bilirubin 0.3 - 1.2 mg/dL 0.4   Alkaline Phos 38 - 126 U/L 69   AST 15 - 41 U/L 13   ALT 0 - 44 U/L 19     Edinburgh Score:     No data to display            After visit meds:  Allergies as of 04/21/2022   No Known Allergies      Medication List     STOP taking these medications    scopolamine 1 MG/3DAYS Commonly known as: TRANSDERM-SCOP       TAKE these medications    famotidine 20 MG tablet Commonly known as: PEPCID Take 1 tablet (20 mg total) by mouth at bedtime.   metformin 1000 MG (OSM) 24 hr tablet Commonly known as: FORTAMET Take 1 tablet (1,000 mg total) by mouth 2 (two) times daily with a meal.   metoCLOPramide 10 MG tablet Commonly known as: Reglan Take 1 tablet (10 mg total) by mouth every 6 (six) hours as needed for nausea or vomiting.   ondansetron 4 MG tablet Commonly known as: ZOFRAN Take 4 mg by mouth every 8 (eight)  hours as needed for nausea or vomiting.         Discharge home in stable condition FU in the office on [place] GLYBURIDE  04/21/2022 Tyson Dense, MD

## 2022-04-21 NOTE — Progress Notes (Signed)
S: Doing well. No c/o. Feeling good. +FM, no lof or VB.  O:  Vitals:   04/21/22 0612 04/21/22 0731  BP: 106/61 117/75  Pulse: 79 (!) 57  Resp: 16 16  Temp: 98.2 F (36.8 C) 97.6 F (36.4 C)  SpO2: 100% 99%    Gen: NAD Abd soft, gravid, nontender Ext: No s/s DVT  A/P: 27 yo G1P0 currently 32.4 wga presenting for gylcemic control.  Sugars have actually been overall ok - highest 167. Has only needed a small amount of SSI.  DM coordinator rec Metformin 1000mg  BID if patient able to tolerate. Continue SSI and will see if needs any.  NST daily.  DM diet.    Arty Baumgartner MD

## 2022-04-25 DIAGNOSIS — O99891 Other specified diseases and conditions complicating pregnancy: Secondary | ICD-10-CM | POA: Diagnosis not present

## 2022-04-25 DIAGNOSIS — Z3A33 33 weeks gestation of pregnancy: Secondary | ICD-10-CM | POA: Diagnosis not present

## 2022-04-30 DIAGNOSIS — Z3A33 33 weeks gestation of pregnancy: Secondary | ICD-10-CM | POA: Diagnosis not present

## 2022-04-30 DIAGNOSIS — O99891 Other specified diseases and conditions complicating pregnancy: Secondary | ICD-10-CM | POA: Diagnosis not present

## 2022-05-03 DIAGNOSIS — O24419 Gestational diabetes mellitus in pregnancy, unspecified control: Secondary | ICD-10-CM | POA: Diagnosis not present

## 2022-05-03 DIAGNOSIS — Z3A34 34 weeks gestation of pregnancy: Secondary | ICD-10-CM | POA: Diagnosis not present

## 2022-05-08 DIAGNOSIS — O99891 Other specified diseases and conditions complicating pregnancy: Secondary | ICD-10-CM | POA: Diagnosis not present

## 2022-05-08 DIAGNOSIS — Z3A35 35 weeks gestation of pregnancy: Secondary | ICD-10-CM | POA: Diagnosis not present

## 2022-05-10 DIAGNOSIS — Z3A35 35 weeks gestation of pregnancy: Secondary | ICD-10-CM | POA: Diagnosis not present

## 2022-05-10 DIAGNOSIS — O368330 Maternal care for abnormalities of the fetal heart rate or rhythm, third trimester, not applicable or unspecified: Secondary | ICD-10-CM | POA: Diagnosis not present

## 2022-05-10 DIAGNOSIS — O24415 Gestational diabetes mellitus in pregnancy, controlled by oral hypoglycemic drugs: Secondary | ICD-10-CM | POA: Diagnosis not present

## 2022-05-11 ENCOUNTER — Encounter (HOSPITAL_COMMUNITY): Payer: Self-pay | Admitting: Obstetrics and Gynecology

## 2022-05-11 ENCOUNTER — Other Ambulatory Visit: Payer: Self-pay

## 2022-05-11 ENCOUNTER — Inpatient Hospital Stay (EMERGENCY_DEPARTMENT_HOSPITAL)
Admission: AD | Admit: 2022-05-11 | Discharge: 2022-05-11 | Disposition: A | Discharge status: Home / Self Care | Payer: BC Managed Care – PPO | Source: Home / Self Care | Attending: Obstetrics and Gynecology | Admitting: Obstetrics and Gynecology

## 2022-05-11 DIAGNOSIS — O26893 Other specified pregnancy related conditions, third trimester: Secondary | ICD-10-CM | POA: Insufficient documentation

## 2022-05-11 DIAGNOSIS — O134 Gestational [pregnancy-induced] hypertension without significant proteinuria, complicating childbirth: Secondary | ICD-10-CM | POA: Diagnosis not present

## 2022-05-11 DIAGNOSIS — O9902 Anemia complicating childbirth: Secondary | ICD-10-CM | POA: Diagnosis not present

## 2022-05-11 DIAGNOSIS — Z3493 Encounter for supervision of normal pregnancy, unspecified, third trimester: Secondary | ICD-10-CM

## 2022-05-11 DIAGNOSIS — O24425 Gestational diabetes mellitus in childbirth, controlled by oral hypoglycemic drugs: Secondary | ICD-10-CM | POA: Diagnosis not present

## 2022-05-11 DIAGNOSIS — O99892 Other specified diseases and conditions complicating childbirth: Secondary | ICD-10-CM | POA: Diagnosis not present

## 2022-05-11 DIAGNOSIS — I959 Hypotension, unspecified: Secondary | ICD-10-CM | POA: Diagnosis not present

## 2022-05-11 DIAGNOSIS — D62 Acute posthemorrhagic anemia: Secondary | ICD-10-CM | POA: Diagnosis not present

## 2022-05-11 DIAGNOSIS — O163 Unspecified maternal hypertension, third trimester: Secondary | ICD-10-CM

## 2022-05-11 DIAGNOSIS — O10913 Unspecified pre-existing hypertension complicating pregnancy, third trimester: Secondary | ICD-10-CM | POA: Insufficient documentation

## 2022-05-11 DIAGNOSIS — Z0542 Observation and evaluation of newborn for suspected metabolic condition ruled out: Secondary | ICD-10-CM | POA: Diagnosis not present

## 2022-05-11 DIAGNOSIS — R519 Headache, unspecified: Secondary | ICD-10-CM | POA: Insufficient documentation

## 2022-05-11 DIAGNOSIS — Z23 Encounter for immunization: Secondary | ICD-10-CM | POA: Diagnosis not present

## 2022-05-11 DIAGNOSIS — O1414 Severe pre-eclampsia complicating childbirth: Secondary | ICD-10-CM | POA: Diagnosis not present

## 2022-05-11 DIAGNOSIS — Z3A35 35 weeks gestation of pregnancy: Secondary | ICD-10-CM | POA: Diagnosis not present

## 2022-05-11 DIAGNOSIS — O164 Unspecified maternal hypertension, complicating childbirth: Secondary | ICD-10-CM | POA: Diagnosis not present

## 2022-05-11 DIAGNOSIS — O2492 Unspecified diabetes mellitus in childbirth: Secondary | ICD-10-CM | POA: Diagnosis not present

## 2022-05-11 LAB — COMPREHENSIVE METABOLIC PANEL
ALT: 13 U/L (ref 0–44)
AST: 16 U/L (ref 15–41)
Albumin: 2.5 g/dL — ABNORMAL LOW (ref 3.5–5.0)
Alkaline Phosphatase: 105 U/L (ref 38–126)
Anion gap: 10 (ref 5–15)
BUN: 13 mg/dL (ref 6–20)
CO2: 21 mmol/L — ABNORMAL LOW (ref 22–32)
Calcium: 8.9 mg/dL (ref 8.9–10.3)
Chloride: 105 mmol/L (ref 98–111)
Creatinine, Ser: 0.7 mg/dL (ref 0.44–1.00)
GFR, Estimated: >60 mL/min (ref >60)
Glucose, Bld: 86 mg/dL (ref 70–99)
Potassium: 3.9 mmol/L (ref 3.5–5.1)
Sodium: 136 mmol/L (ref 135–145)
Total Bilirubin: 0.6 mg/dL (ref 0.3–1.2)
Total Protein: 5.9 g/dL — ABNORMAL LOW (ref 6.5–8.1)

## 2022-05-11 LAB — WET PREP, GENITAL
Clue Cells Wet Prep HPF POC: NONE SEEN
Sperm: NONE SEEN
Trich, Wet Prep: NONE SEEN
WBC, Wet Prep HPF POC: <10 (ref <10)
Yeast Wet Prep HPF POC: NONE SEEN

## 2022-05-11 LAB — CBC
HCT: 31.5 % — ABNORMAL LOW (ref 36.0–46.0)
Hemoglobin: 10.1 g/dL — ABNORMAL LOW (ref 12.0–15.0)
MCH: 25.6 pg — ABNORMAL LOW (ref 26.0–34.0)
MCHC: 32.1 g/dL (ref 30.0–36.0)
MCV: 79.9 fL — ABNORMAL LOW (ref 80.0–100.0)
Platelets: 243 10*3/uL (ref 150–400)
RBC: 3.94 MIL/uL (ref 3.87–5.11)
RDW: 15.3 % (ref 11.5–15.5)
WBC: 11.5 10*3/uL — ABNORMAL HIGH (ref 4.0–10.5)
nRBC: 0 % (ref 0.0–0.2)

## 2022-05-11 LAB — PROTEIN / CREATININE RATIO, URINE
Creatinine, Urine: 44 mg/dL
Protein Creatinine Ratio: 1.77 mg/mg{Cre} — ABNORMAL HIGH (ref 0.00–0.15)
Total Protein, Urine: 78 mg/dL

## 2022-05-11 LAB — AMNISURE RUPTURE OF MEMBRANE (ROM) NOT AT ARMC: Amnisure ROM: NEGATIVE

## 2022-05-11 MED ORDER — CYCLOBENZAPRINE HCL 5 MG PO TABS
10.0000 mg | ORAL_TABLET | Freq: Once | ORAL | Status: AC
  Administered 2022-05-11: 10 mg via ORAL
  Filled 2022-05-11: qty 2

## 2022-05-11 MED ORDER — FAMOTIDINE 20 MG PO TABS
20.0000 mg | ORAL_TABLET | Freq: Once | ORAL | Status: AC
  Administered 2022-05-11: 20 mg via ORAL
  Filled 2022-05-11: qty 1

## 2022-05-11 MED ORDER — ACETAMINOPHEN-CAFFEINE 500-65 MG PO TABS
2.0000 | ORAL_TABLET | Freq: Once | ORAL | Status: AC
  Administered 2022-05-11: 2 via ORAL
  Filled 2022-05-11: qty 2

## 2022-05-11 NOTE — MAU Note (Addendum)
.  Stacy Black is a 27 y.o. at [redacted]w[redacted]d here in MAU reporting: Pt reports high blood pressure. Pt reports swelling in hands and feet for 2-3 days.  Pt reports headache today. Pt reports noticing wet underwear that started today. She is unsure if her water broke thinks she is having increased discharge   Onset of complaint: today  Pain score: 4 headache  There were no vitals filed for this visit.    Lab orders placed from triage:   none

## 2022-05-11 NOTE — MAU Provider Note (Signed)
History     CSN: [id number]  Arrival date and time: 05/11/22 4098   Event Date/Time   First Provider Initiated Contact with Patient 05/11/22 1906      No chief complaint on file.  HPI Stacy Black is a 27 y.o. G1P0 at [redacted]w[redacted]d who presents to MAU with chief complaint of elevated blood pressure on her home cuff. This is a new problem, onset today. She denies history of HTN. She is not experiencing visual disturbances or RUQ pain.  Patient also reports new onset headache, pain score 4/10. She denies aggravating or alleviating factors. She has not taken medication for this complaint.  Patient reports feeling "wet underwear". She is not sure if her water is broken. She denies gush of fluid. She denies ongoing active leaking of fluid.  Patient's pregnancy is c/b A2GDM. She is prescribed Metformin 1000 mg BID but it is out of stock so she is taking her previously prescribed Glyburide 2.5 mg BID for now.  She reports most CBGs at home are WNL.  Patient receives care with [facility].  OB History     Gravida  1   Para      Term      Preterm      AB      Living         SAB      IAB      Ectopic      Multiple      Live Births              Past Medical History:  Diagnosis Date   Hypertension 2022    Past Surgical History:  Procedure Laterality Date   INCISION AND DRAINAGE PERITONSILLAR ABSCESS Right 12/2019    Family History  Problem Relation Age of Onset   Diabetes Father     Social History   Tobacco Use   Smoking status: Never   Smokeless tobacco: Never  Vaping Use   Vaping Use: Never used  Substance Use Topics   Alcohol use: Not Currently   Drug use: Never    Allergies: No Known Allergies  Medications Prior to Admission  Medication Sig Dispense Refill Last Dose   glyBURIDE (DIABETA) 2.5 MG tablet Take 2.5 mg by mouth daily with breakfast.   05/11/2022   ondansetron (ZOFRAN) 4 MG tablet Take 4 mg by mouth every 8 (eight) hours as  needed for nausea or vomiting.   05/11/2022   famotidine (PEPCID) 20 MG tablet Take 1 tablet (20 mg total) by mouth at bedtime. 30 tablet 1    metFORMIN (FORTAMET) 1000 MG (OSM) 24 hr tablet Take 1 tablet (1,000 mg total) by mouth 2 (two) times daily with a meal. 90 tablet 1    metoCLOPramide (REGLAN) 10 MG tablet Take 1 tablet (10 mg total) by mouth every 6 (six) hours as needed for nausea or vomiting. 30 tablet 1     Review of Systems  Gastrointestinal:  Negative for abdominal pain.  Neurological:  Positive for headaches.  All other systems reviewed and are negative.  Physical Exam   Blood pressure (!) 145/91, pulse 65, temperature 98.4 F (36.9 C), resp. rate 20, height  (1.676 m), weight 97.3 kg, last menstrual period 09/05/2021, SpO2 100 %.  Physical Exam Vitals and nursing note reviewed. Exam conducted with a chaperone present.  Constitutional:      Appearance: Normal appearance. She is not ill-appearing.  Cardiovascular:     Rate and Rhythm: Normal rate.  Pulses: Normal pulses.  Pulmonary:     Effort: Pulmonary effort is normal.  Abdominal:     Comments: Gravid  Genitourinary:    Comments: Pelvic exam: External genitalia normal, vaginal walls pink and well rugated, cervix visually closed, no lesions noted. Negative pooling   Skin:    Capillary Refill: Capillary refill takes less than 2 seconds.  Neurological:     Mental Status: She is alert and oriented to person, place, and time.  Psychiatric:        Mood and Affect: Mood normal.        Behavior: Behavior normal.        Thought Content: Thought content normal.        Judgment: Judgment normal.     MAU Course  Procedures  MDM --2030: CNM returned to bedside. Reviewed negative Amnisure. Patient reports headache pain is "much better" but reports pain score is now 3 vs 4/10. PEC labs ordered stat at 1915, not yet collected. Nikki RN has notified Phlebotomy of active orders for stat lab draw. Phlebotomy at  bedside around 2035 to collect labs  --2137: Evaluation and lab results discussed with Dr. Despina Hidden, who recommends admission for observation  --2139: Evaluation including vital signs, lab results reviewed with Dr. Elon Spanner. Also discussed improved but present headache with pain score now 3/10. Per Dr. Elon Spanner, if patient is comfortable with outpatient management, patient to discharge home with return precautions. Keep existing appointment for Monday  --2145: CNM at bedside to discuss Dr. Arlester Marker plan of care. Patient and her husband verbalize they are comfortable with the plan for discharge home with strict return precautions. Indications for return to MAU reviewed. Discussed criteria for severe range BP, reviewed severe symptoms, advised patient to take blood pressure TID or with onset of headache. New occurrence of severe symptoms including severe BP in home cuff would necessitate immediate return to MAU, do not stay home to wait for Monday's appointment. Lowella Bandy, RN present for discussion. Patient and husband verbalized their understanding and were able to complete teach back to CNM.  --Reactive tracing: baseline 140, mod var, + 15 x 15 accels, no decels  --Toco: rare contraction  --Intact amniotic sac: negative pooling, negative fern, negative Amnisure  Patient Vitals for the past 24 hrs:  BP Temp Pulse Resp SpO2 Height Weight  05/11/22 2131 (!) 144/82 -- (!) 57 -- -- -- --  05/11/22 2116 (!) 153/91 -- (!) 55 -- -- -- --  05/11/22 2101 135/89 -- 64 -- -- -- --  05/11/22 2046 (!) 141/94 -- 66 -- -- -- --  05/11/22 2033 (!) 145/88 -- (!) 58 -- -- -- --  05/11/22 2016 (!) 151/101 -- 83 -- -- -- --  05/11/22 2001 (!) 146/89 -- (!) 57 -- -- -- --  05/11/22 1931 (!) 151/96 -- 64 -- -- -- --  05/11/22 1916 (!) 154/87 -- (!) 59 -- -- -- --  05/11/22 1900 (!) 145/91 98.4 F (36.9 C) 65 20 100 % -- --  05/11/22 1849 -- -- -- -- --  (1.676 m) 97.3 kg   Results for orders placed or performed during  the hospital encounter of 05/11/22 (from the past 24 hour(s))  Protein / creatinine ratio, urine     Status: Abnormal   Collection Time: 05/11/22  7:49 PM  Result Value Ref Range   Creatinine, Urine 44 mg/dL   Total Protein, Urine 78 mg/dL   Protein Creatinine Ratio 1.77 (H) 0.00 - 0.15 mg/mg[Cre]  Amnisure  rupture of membrane (rom)not at [facility]     Status: None   Collection Time: 05/11/22  7:49 PM  Result Value Ref Range   Amnisure ROM NEGATIVE   Wet prep, genital     Status: None   Collection Time: 05/11/22  7:49 PM   Specimen: Urine, Clean Catch  Result Value Ref Range   Yeast Wet Prep HPF POC NONE SEEN NONE SEEN   Trich, Wet Prep NONE SEEN NONE SEEN   Clue Cells Wet Prep HPF POC NONE SEEN NONE SEEN   WBC, Wet Prep HPF POC <10 <10   Sperm NONE SEEN   CBC     Status: Abnormal   Collection Time: 05/11/22  8:32 PM  Result Value Ref Range   WBC 11.5 (H) 4.0 - 10.5 K/uL   RBC 3.94 3.87 - 5.11 MIL/uL   Hemoglobin 10.1 (L) 12.0 - 15.0 g/dL   HCT 29.5 (L) 74.7 - 34.0 %   MCV 79.9 (L) 80.0 - 100.0 fL   MCH 25.6 (L) 26.0 - 34.0 pg   MCHC 32.1 30.0 - 36.0 g/dL   RDW 37.0 96.4 - 38.3 %   Platelets 243 150 - 400 K/uL   nRBC 0.0 0.0 - 0.2 %  Comprehensive metabolic panel     Status: Abnormal   Collection Time: 05/11/22  8:32 PM  Result Value Ref Range   Sodium 136 135 - 145 mmol/L   Potassium 3.9 3.5 - 5.1 mmol/L   Chloride 105 98 - 111 mmol/L   CO2 21 (L) 22 - 32 mmol/L   Glucose, Bld 86 70 - 99 mg/dL   BUN 13 6 - 20 mg/dL   Creatinine, Ser 8.18 0.44 - 1.00 mg/dL   Calcium 8.9 8.9 - 40.3 mg/dL   Total Protein 5.9 (L) 6.5 - 8.1 g/dL   Albumin 2.5 (L) 3.5 - 5.0 g/dL   AST 16 15 - 41 U/L   ALT 13 0 - 44 U/L   Alkaline Phosphatase 105 38 - 126 U/L   Total Bilirubin 0.6 0.3 - 1.2 mg/dL   GFR, Estimated >75 >43 mL/min   Anion gap 10 5 - 15   Meds ordered this encounter  Medications   famotidine (PEPCID) tablet 20 mg   acetaminophen-caffeine (EXCEDRIN TENSION HEADACHE) 500-65 MG  per tablet 2 tablet   cyclobenzaprine (FLEXERIL) tablet 10 mg    Assessment and Plan  --26 y.o. G1P0 at [redacted]w[redacted]d  --Reactive tracing --Closed cervix --Intact amniotic sac --New onset elevated BP --P:Cr ratio 1.77 --Headache improved to 3/10 prior to discharge --Care coordinated with Dr. Despina Hidden and Dr. Elon Spanner --Per Dr. Elon Spanner, discharge home in stable condition with strict return precautions  Calvert Cantor, MSA, MSN, CNM

## 2022-05-12 ENCOUNTER — Encounter: Payer: Self-pay | Admitting: Obstetrics and Gynecology

## 2022-05-12 ENCOUNTER — Inpatient Hospital Stay (HOSPITAL_COMMUNITY)
Admission: AD | Admit: 2022-05-12 | Discharge: 2022-05-19 | DRG: 806 | Disposition: A | Discharge status: Home / Self Care | Payer: BC Managed Care – PPO | Attending: Obstetrics and Gynecology | Admitting: Obstetrics and Gynecology

## 2022-05-12 ENCOUNTER — Encounter (HOSPITAL_COMMUNITY): Payer: Self-pay | Admitting: Obstetrics and Gynecology

## 2022-05-12 DIAGNOSIS — O99892 Other specified diseases and conditions complicating childbirth: Secondary | ICD-10-CM | POA: Diagnosis not present

## 2022-05-12 DIAGNOSIS — I959 Hypotension, unspecified: Secondary | ICD-10-CM | POA: Diagnosis not present

## 2022-05-12 DIAGNOSIS — D62 Acute posthemorrhagic anemia: Secondary | ICD-10-CM | POA: Diagnosis not present

## 2022-05-12 DIAGNOSIS — Z3A35 35 weeks gestation of pregnancy: Secondary | ICD-10-CM | POA: Diagnosis not present

## 2022-05-12 DIAGNOSIS — O24425 Gestational diabetes mellitus in childbirth, controlled by oral hypoglycemic drugs: Secondary | ICD-10-CM | POA: Diagnosis present

## 2022-05-12 DIAGNOSIS — O1414 Severe pre-eclampsia complicating childbirth: Secondary | ICD-10-CM | POA: Diagnosis present

## 2022-05-12 DIAGNOSIS — O9902 Anemia complicating childbirth: Secondary | ICD-10-CM | POA: Diagnosis present

## 2022-05-12 DIAGNOSIS — O163 Unspecified maternal hypertension, third trimester: Secondary | ICD-10-CM | POA: Diagnosis not present

## 2022-05-12 DIAGNOSIS — Z3490 Encounter for supervision of normal pregnancy, unspecified, unspecified trimester: Secondary | ICD-10-CM

## 2022-05-12 DIAGNOSIS — O134 Gestational [pregnancy-induced] hypertension without significant proteinuria, complicating childbirth: Secondary | ICD-10-CM | POA: Diagnosis present

## 2022-05-12 LAB — URINALYSIS, ROUTINE W REFLEX MICROSCOPIC
Bilirubin Urine: NEGATIVE
Glucose, UA: NEGATIVE mg/dL
Hgb urine dipstick: NEGATIVE
Ketones, ur: NEGATIVE mg/dL
Leukocytes,Ua: NEGATIVE
Nitrite: NEGATIVE
Protein, ur: >=300 mg/dL — AB
Specific Gravity, Urine: 1.02 (ref 1.005–1.030)
pH: 7 (ref 5.0–8.0)

## 2022-05-12 LAB — CBC
HCT: 29.5 % — ABNORMAL LOW (ref 36.0–46.0)
Hemoglobin: 9.1 g/dL — ABNORMAL LOW (ref 12.0–15.0)
MCH: 25.1 pg — ABNORMAL LOW (ref 26.0–34.0)
MCHC: 30.8 g/dL (ref 30.0–36.0)
MCV: 81.3 fL (ref 80.0–100.0)
Platelets: 229 10*3/uL (ref 150–400)
RBC: 3.63 MIL/uL — ABNORMAL LOW (ref 3.87–5.11)
RDW: 15.5 % (ref 11.5–15.5)
WBC: 7.5 10*3/uL (ref 4.0–10.5)
nRBC: 0 % (ref 0.0–0.2)

## 2022-05-12 LAB — GLUCOSE, CAPILLARY
Glucose-Capillary: 137 mg/dL — ABNORMAL HIGH (ref 70–99)
Glucose-Capillary: 126 mg/dL — ABNORMAL HIGH (ref 70–99)
Glucose-Capillary: 107 mg/dL — ABNORMAL HIGH (ref 70–99)
Glucose-Capillary: 121 mg/dL — ABNORMAL HIGH (ref 70–99)

## 2022-05-12 LAB — COMPREHENSIVE METABOLIC PANEL
ALT: 10 U/L (ref 0–44)
AST: 15 U/L (ref 15–41)
Albumin: 2.2 g/dL — ABNORMAL LOW (ref 3.5–5.0)
Alkaline Phosphatase: 92 U/L (ref 38–126)
Anion gap: 11 (ref 5–15)
BUN: 11 mg/dL (ref 6–20)
CO2: 19 mmol/L — ABNORMAL LOW (ref 22–32)
Calcium: 8 mg/dL — ABNORMAL LOW (ref 8.9–10.3)
Chloride: 105 mmol/L (ref 98–111)
Creatinine, Ser: 0.77 mg/dL (ref 0.44–1.00)
GFR, Estimated: >60 mL/min (ref >60)
Glucose, Bld: 142 mg/dL — ABNORMAL HIGH (ref 70–99)
Potassium: 3.9 mmol/L (ref 3.5–5.1)
Sodium: 135 mmol/L (ref 135–145)
Total Bilirubin: 0.3 mg/dL (ref 0.3–1.2)
Total Protein: 5.4 g/dL — ABNORMAL LOW (ref 6.5–8.1)

## 2022-05-12 LAB — TYPE AND SCREEN
ABO/RH(D): B POS
Antibody Screen: NEGATIVE

## 2022-05-12 LAB — GROUP B STREP BY PCR: Group B strep by PCR: NEGATIVE

## 2022-05-12 MED ORDER — METFORMIN HCL ER 500 MG PO TB24: 1000.0000 mg | ORAL_TABLET | Freq: Two times a day (BID) | ORAL | Status: DC

## 2022-05-12 MED ORDER — ZOLPIDEM TARTRATE 5 MG PO TABS: 5.0000 mg | ORAL_TABLET | Freq: Every evening | ORAL | Status: DC | PRN

## 2022-05-12 MED ORDER — CALCIUM CARBONATE ANTACID 500 MG PO CHEW: 2.0000 | CHEWABLE_TABLET | ORAL | Status: DC | PRN

## 2022-05-12 MED ORDER — INSULIN ASPART 100 UNIT/ML IJ SOLN
0.0000 [IU] | Freq: Three times a day (TID) | INTRAMUSCULAR | Status: DC
  Administered 2022-05-12: 2 [IU] via SUBCUTANEOUS

## 2022-05-12 MED ORDER — ACETAMINOPHEN 500 MG PO TABS
1000.0000 mg | ORAL_TABLET | Freq: Four times a day (QID) | ORAL | Status: DC | PRN
  Administered 2022-05-12 – 2022-05-13 (×2): 1000 mg via ORAL
  Filled 2022-05-12 (×2): qty 2

## 2022-05-12 MED ORDER — LABETALOL HCL 5 MG/ML IV SOLN: 40.0000 mg | INTRAVENOUS | Status: DC | PRN

## 2022-05-12 MED ORDER — HYDRALAZINE HCL 20 MG/ML IJ SOLN
10.0000 mg | INTRAMUSCULAR | Status: DC | PRN
  Administered 2022-05-13: 10 mg via INTRAVENOUS
  Filled 2022-05-12: qty 1

## 2022-05-12 MED ORDER — OXYCODONE HCL 5 MG PO TABS
10.0000 mg | ORAL_TABLET | ORAL | Status: DC | PRN
  Administered 2022-05-12 – 2022-05-13 (×2): 10 mg via ORAL
  Filled 2022-05-12 (×2): qty 2

## 2022-05-12 MED ORDER — PRENATAL MULTIVITAMIN CH
1.0000 | ORAL_TABLET | Freq: Every day | ORAL | Status: DC
  Administered 2022-05-12 – 2022-05-13 (×2): 1 via ORAL
  Filled 2022-05-12 (×2): qty 1

## 2022-05-12 MED ORDER — METFORMIN HCL 500 MG PO TABS
1000.0000 mg | ORAL_TABLET | Freq: Two times a day (BID) | ORAL | Status: DC
  Administered 2022-05-12 – 2022-05-14 (×5): 1000 mg via ORAL
  Filled 2022-05-12 (×7): qty 2

## 2022-05-12 MED ORDER — LACTATED RINGERS IV SOLN: 125.0000 mL/h | INTRAVENOUS | Status: AC

## 2022-05-12 MED ORDER — FAMOTIDINE 20 MG PO TABS
20.0000 mg | ORAL_TABLET | Freq: Every day | ORAL | Status: DC
  Administered 2022-05-12 – 2022-05-17 (×6): 20 mg via ORAL
  Filled 2022-05-12 (×7): qty 1

## 2022-05-12 MED ORDER — LABETALOL HCL 200 MG PO TABS
200.0000 mg | ORAL_TABLET | Freq: Two times a day (BID) | ORAL | Status: DC
  Filled 2022-05-12: qty 1

## 2022-05-12 MED ORDER — ONDANSETRON HCL 4 MG PO TABS
4.0000 mg | ORAL_TABLET | Freq: Three times a day (TID) | ORAL | Status: DC | PRN
  Administered 2022-05-13 (×3): 4 mg via ORAL
  Filled 2022-05-12 (×4): qty 1

## 2022-05-12 MED ORDER — LABETALOL HCL 5 MG/ML IV SOLN: 80.0000 mg | INTRAVENOUS | Status: DC | PRN

## 2022-05-12 MED ORDER — BUTALBITAL-APAP-CAFFEINE 50-325-40 MG PO TABS
2.0000 | ORAL_TABLET | Freq: Once | ORAL | Status: AC
  Administered 2022-05-12: 2 via ORAL
  Filled 2022-05-12: qty 2

## 2022-05-12 MED ORDER — GLYBURIDE 2.5 MG PO TABS
2.5000 mg | ORAL_TABLET | Freq: Two times a day (BID) | ORAL | Status: DC
  Filled 2022-05-12 (×2): qty 1

## 2022-05-12 MED ORDER — CYCLOBENZAPRINE HCL 10 MG PO TABS: 10.0000 mg | ORAL_TABLET | Freq: Three times a day (TID) | ORAL | Status: DC | PRN

## 2022-05-12 MED ORDER — LABETALOL HCL 5 MG/ML IV SOLN: 20.0000 mg | INTRAVENOUS | Status: DC | PRN

## 2022-05-12 MED ORDER — NIFEDIPINE ER OSMOTIC RELEASE 30 MG PO TB24
30.0000 mg | ORAL_TABLET | Freq: Every day | ORAL | Status: DC
  Administered 2022-05-12: 30 mg via ORAL
  Filled 2022-05-12: qty 1

## 2022-05-12 MED ORDER — DOCUSATE SODIUM 100 MG PO CAPS
100.0000 mg | ORAL_CAPSULE | Freq: Every day | ORAL | Status: DC
  Administered 2022-05-12: 100 mg via ORAL
  Filled 2022-05-12 (×2): qty 1

## 2022-05-12 NOTE — Progress Notes (Signed)
Pain at zero.  Normotensive.  Ctm.

## 2022-05-12 NOTE — MAU Provider Note (Signed)
History     CSN: [id number]  Arrival date and time: 05/12/22 4098   Event Date/Time   First Provider Initiated Contact with Patient 05/12/22 1031      Chief Complaint  Patient presents with  . Headache  . Hypertension  . Decreased Fetal Movement   HPI  Stacy Black is a 27 y.o. G1P0 at [redacted]w[redacted]d who presents for evaluation of headache and elevated blood pressures. Patient reports she woke up this morning with more of a headache than yesterday when she was seen in MAU. Patient rates the pain as a 6/10 and has not tried anything for the pain. She reports she checked her blood pressures at home and they were all in the 170s/110s. She denies any visual changes or epigastric pain.  She denies any vaginal bleeding, discharge, and leaking of fluid. Denies any constipation, diarrhea or any urinary complaints. Reports normal fetal movement.   OB History     Gravida  1   Para      Term      Preterm      AB      Living         SAB      IAB      Ectopic      Multiple      Live Births              Past Medical History:  Diagnosis Date  . Hypertension 2022    Past Surgical History:  Procedure Laterality Date  . INCISION AND DRAINAGE PERITONSILLAR ABSCESS Right 12/2019    Family History  Problem Relation Age of Onset  . Diabetes Father     Social History   Tobacco Use  . Smoking status: Never  . Smokeless tobacco: Never  Vaping Use  . Vaping Use: Never used  Substance Use Topics  . Alcohol use: Not Currently  . Drug use: Never    Allergies: No Known Allergies  Medications Prior to Admission  Medication Sig Dispense Refill Last Dose  . famotidine (PEPCID) 20 MG tablet Take 1 tablet (20 mg total) by mouth at bedtime. 30 tablet 1 05/11/2022  . glyBURIDE (DIABETA) 2.5 MG tablet Take 2.5 mg by mouth daily with breakfast.   05/11/2022  . metFORMIN (FORTAMET) 1000 MG (OSM) 24 hr tablet Take 1 tablet (1,000 mg total) by mouth 2 (two) times daily with a  meal. 90 tablet 1   . metoCLOPramide (REGLAN) 10 MG tablet Take 1 tablet (10 mg total) by mouth every 6 (six) hours as needed for nausea or vomiting. 30 tablet 1   . ondansetron (ZOFRAN) 4 MG tablet Take 4 mg by mouth every 8 (eight) hours as needed for nausea or vomiting.       Review of Systems  Constitutional: Negative.  Negative for fatigue and fever.  HENT: Negative.    Respiratory: Negative.  Negative for shortness of breath.   Cardiovascular: Negative.  Negative for chest pain.  Gastrointestinal: Negative.  Negative for abdominal pain, constipation, diarrhea, nausea and vomiting.  Genitourinary: Negative.  Negative for dysuria, vaginal bleeding and vaginal discharge.  Neurological:  Positive for headaches. Negative for dizziness.   Physical Exam   Blood pressure 132/88, pulse 68, temperature 98.5 F (36.9 C), temperature source Oral, resp. rate 14, weight 96.8 kg, last menstrual period 09/05/2021, SpO2 100 %.  Patient Vitals for the past 24 hrs:  BP Temp Temp src Pulse Resp SpO2 Weight  05/12/22 1230 131/88 -- -- (!) 56 -- -- --  05/12/22 1215 (!) 146/95 -- -- (!) 58 -- -- --  05/12/22 1200 (!) 148/96 -- -- (!) 59 -- -- --  05/12/22 1145 (!) 145/103 -- -- 61 -- -- --  05/12/22 1130 136/89 -- -- 60 -- -- --  05/12/22 1115 136/88 -- -- 73 -- -- --  05/12/22 1100 131/83 -- -- 61 -- -- --  05/12/22 1045 134/88 -- -- 70 -- -- --  05/12/22 1029 132/88 -- -- 68 -- 100 % --  05/12/22 1010 (!) 129/96 98.5 F (36.9 C) Oral 77 14 99 % 96.8 kg    Physical Exam Vitals and nursing note reviewed.  Constitutional:      General: She is not in acute distress.    Appearance: She is well-developed.  HENT:     Head: Normocephalic.  Eyes:     Pupils: Pupils are equal, round, and reactive to light.  Cardiovascular:     Rate and Rhythm: Normal rate and regular rhythm.     Heart sounds: Normal heart sounds.  Pulmonary:     Effort: Pulmonary effort is normal. No respiratory distress.      Breath sounds: Normal breath sounds.  Abdominal:     General: Bowel sounds are normal. There is no distension.     Palpations: Abdomen is soft.     Tenderness: There is no abdominal tenderness.  Skin:    General: Skin is warm and dry.  Neurological:     Mental Status: She is alert and oriented to person, place, and time.     Motor: No abnormal muscle tone.     Coordination: Coordination normal.     Deep Tendon Reflexes: Reflexes are normal and symmetric. Reflexes normal.  Psychiatric:        Behavior: Behavior normal.        Thought Content: Thought content normal.        Judgment: Judgment normal.    Fetal Tracing:  Baseline: 140  Variability: moderate Accels: 15x15 Decels: none  Toco: none      MAU Course  Procedures  Results for orders placed or performed during the hospital encounter of 05/12/22 (from the past 24 hour(s))  Urinalysis, Routine w reflex microscopic -Urine, Clean Catch     Status: Abnormal   Collection Time: 05/12/22 10:21 AM  Result Value Ref Range   Color, Urine YELLOW YELLOW   APPearance HAZY (A) CLEAR   Specific Gravity, Urine 1.020 1.005 - 1.030   pH 7.0 5.0 - 8.0   Glucose, UA NEGATIVE NEGATIVE mg/dL   Hgb urine dipstick NEGATIVE NEGATIVE   Bilirubin Urine NEGATIVE NEGATIVE   Ketones, ur NEGATIVE NEGATIVE mg/dL   Protein, ur >=213 (A) NEGATIVE mg/dL   Nitrite NEGATIVE NEGATIVE   Leukocytes,Ua NEGATIVE NEGATIVE   RBC / HPF 0-5 0 - 5 RBC/hpf   WBC, UA 0-5 0 - 5 WBC/hpf   Bacteria, UA RARE (A) NONE SEEN   Squamous Epithelial / HPF 6-10 0 - 5 /HPF   Mucus PRESENT   Type and screen [facility]     Status: None (Preliminary result)   Collection Time: 05/12/22 11:00 AM  Result Value Ref Range   ABO/RH(D) PENDING    Antibody Screen PENDING    Sample Expiration      05/15/2022,2359 Performed at [facility], [address] [place], [place] [address]   CBC     Status: Abnormal   Collection Time: 05/12/22 11:01 AM   Result Value Ref Range  WBC 7.5 4.0 - 10.5 K/uL   RBC 3.63 (L) 3.87 - 5.11 MIL/uL   Hemoglobin 9.1 (L) 12.0 - 15.0 g/dL   HCT 16.1 (L) 09.6 - 04.5 %   MCV 81.3 80.0 - 100.0 fL   MCH 25.1 (L) 26.0 - 34.0 pg   MCHC 30.8 30.0 - 36.0 g/dL   RDW 40.9 81.1 - 91.4 %   Platelets 229 150 - 400 K/uL   nRBC 0.0 0.0 - 0.2 %  Comprehensive metabolic panel     Status: Abnormal   Collection Time: 05/12/22 11:01 AM  Result Value Ref Range   Sodium 135 135 - 145 mmol/L   Potassium 3.9 3.5 - 5.1 mmol/L   Chloride 105 98 - 111 mmol/L   CO2 19 (L) 22 - 32 mmol/L   Glucose, Bld 142 (H) 70 - 99 mg/dL   BUN 11 6 - 20 mg/dL   Creatinine, Ser 7.82 0.44 - 1.00 mg/dL   Calcium 8.0 (L) 8.9 - 10.3 mg/dL   Total Protein 5.4 (L) 6.5 - 8.1 g/dL   Albumin 2.2 (L) 3.5 - 5.0 g/dL   AST 15 15 - 41 U/L   ALT 10 0 - 44 U/L   Alkaline Phosphatase 92 38 - 126 U/L   Total Bilirubin 0.3 0.3 - 1.2 mg/dL   GFR, Estimated >95 >62 mL/min   Anion gap 11 5 - 15  Glucose, capillary     Status: Abnormal   Collection Time: 05/12/22 11:05 AM  Result Value Ref Range   Glucose-Capillary 137 (H) 70 - 99 mg/dL     MDM Prenatal records from community office reviewed. Pregnancy complicated by preeclampsia without severe features diagnosed 4/12 Labs ordered and reviewed.   UA CBG CBC, CMP Fioricet  Patient reports some improvement of HA but still reporting feeling unwell.  CNM notified Dr. Elon Spanner of patient's complaint and results- will come see patient in MAU and admit to [facility] for observation.  Assessment and Plan   1. Hypertension affecting pregnancy in third trimester   2. [redacted] weeks gestation of pregnancy     -Admit to [facility] -Care turned over to MD  Rolm Bookbinder, CNM 05/12/2022, 10:31 AM

## 2022-05-12 NOTE — Inpatient Diabetes Management (Signed)
ADA Standards of Care 2024 Diabetes in Pregnancy Target Glucose Ranges:  Fasting: 70 - 95 mg/dL 1 hr postprandial:  656 - 140mg /dL (from first bite of meal) 2 hr postprandial:  100 - 120 mg/dL (from first bit of meal)  Note history of GDM.  Patient takes Glyburide 2.5 mg daily.  Post-prandial CBG's ordered as well as Glyburide in the hospital.   If PP CBG's>120 mg/dL, may need the addition of Novolog correction 0-14 units 2 hour PP.  Thanks,  Beryl Meager, RN, BC-ADM Inpatient Diabetes Coordinator Pager [phone]  (8a-5p)

## 2022-05-12 NOTE — H&P (Signed)
Stacy Black is a 27 y.o. female presenting for headache. She did not take any medications for it. She had one elevated severe range BP at home and came to triage. She was noted to have normal Bps on arrival but now does have some mild range Bps. HA has improved to mild with fioricet. She denies other s/s PIH.  She has a history of hyperemesis early on that has improved. Has well controlled GDM - on Glyburide 2.5mg  BID.   OB History     Gravida  1   Para      Term      Preterm      AB      Living         SAB      IAB      Ectopic      Multiple      Live Births             Past Medical History:  Diagnosis Date   Hypertension 2022   Past Surgical History:  Procedure Laterality Date   INCISION AND DRAINAGE PERITONSILLAR ABSCESS Right 12/2019   Family History: family history includes Diabetes in her father. Social History:  reports that she has never smoked. She has never used smokeless tobacco. She reports that she does not currently use alcohol. She reports that she does not use drugs.     Maternal Diabetes: Yes:  Diabetes Type:  Insulin/Medication controlled Genetic Screening: Normal Maternal Ultrasounds/Referrals: Normal Fetal Ultrasounds or other Referrals:  None Maternal Substance Abuse:  No Significant Maternal Medications:  None Significant Maternal Lab Results:  None Number of Prenatal Visits:greater than 3 verified prenatal visits Other Comments:  None  Review of Systems History   Blood pressure 131/88, pulse (!) 56, temperature 98.5 F (36.9 C), temperature source Oral, resp. rate 14, weight 96.8 kg, last menstrual period 09/05/2021, SpO2 100 %. Exam Physical Exam  NAD, A&O NWOB Abd soft, nondistended, gravid SVE deferred - was closed yesterday in MAU  Prenatal labs: ABO, Rh: --/--/B POS (03/22 1520) Antibody: NEG (03/22 1520) Rubella:   RPR:    HBsAg:    HIV:    GBS:   unknown  Assessment/Plan: 27 yo G1P0 presenting at 56.4 wga  with gestational HTN. HA responsive to PO meds. Bps normal to mild range.  Given second time she presented to MAU, will keep for close observation.  Consider initiating BP meds if Bps become persistently elevated in mild or moderate range. If persistent severe range Bps or HA is severe and not responsive to treatment, delivery would be indicated.   - NST q shift - daily labs (reassuring except UPC is elevated) - Continue home glyburide - DM coordinator consulted - BS fasting and 2 hours pp every meal - next growth due 05/16/22 - GBS unknown - swab collected, would be for PCN if IOL/labors   Ranae Pila 05/12/2022, 12:31 PM

## 2022-05-12 NOTE — MAU Note (Signed)
.  Stacy Black is a 27 y.o. at [redacted]w[redacted]d here in MAU reporting: Woke up this morning with a headache, was told to check her blood pressure this morning and it was 178/113. Has not taken anything for her headache. Denies VB or LOF. Has not felt fetal movement today. Onset of complaint: today  Pain score: 6 Vitals:   05/12/22 1010  BP: (!) 129/96  Pulse: 77  Resp: 14  Temp: 98.5 F (36.9 C)  SpO2: 99%     FHT:159 Lab orders placed from triage:  UA

## 2022-05-12 NOTE — Progress Notes (Signed)
Called patient to check in. Left VM to call back.

## 2022-05-13 ENCOUNTER — Encounter (HOSPITAL_COMMUNITY): Payer: Self-pay | Admitting: Obstetrics and Gynecology

## 2022-05-13 ENCOUNTER — Other Ambulatory Visit: Payer: Self-pay

## 2022-05-13 LAB — COMPREHENSIVE METABOLIC PANEL
ALT: 11 U/L (ref 0–44)
AST: 16 U/L (ref 15–41)
Albumin: 2.1 g/dL — ABNORMAL LOW (ref 3.5–5.0)
Alkaline Phosphatase: 90 U/L (ref 38–126)
Anion gap: 8 (ref 5–15)
BUN: 13 mg/dL (ref 6–20)
CO2: 18 mmol/L — ABNORMAL LOW (ref 22–32)
Calcium: 8.3 mg/dL — ABNORMAL LOW (ref 8.9–10.3)
Chloride: 108 mmol/L (ref 98–111)
Creatinine, Ser: 0.7 mg/dL (ref 0.44–1.00)
GFR, Estimated: >60 mL/min (ref >60)
Glucose, Bld: 90 mg/dL (ref 70–99)
Potassium: 3.7 mmol/L (ref 3.5–5.1)
Sodium: 134 mmol/L — ABNORMAL LOW (ref 135–145)
Total Bilirubin: 0.4 mg/dL (ref 0.3–1.2)
Total Protein: 5 g/dL — ABNORMAL LOW (ref 6.5–8.1)

## 2022-05-13 LAB — CBC
HCT: 27.7 % — ABNORMAL LOW (ref 36.0–46.0)
Hemoglobin: 9.1 g/dL — ABNORMAL LOW (ref 12.0–15.0)
MCH: 25.9 pg — ABNORMAL LOW (ref 26.0–34.0)
MCHC: 32.9 g/dL (ref 30.0–36.0)
MCV: 78.9 fL — ABNORMAL LOW (ref 80.0–100.0)
Platelets: 203 10*3/uL (ref 150–400)
RBC: 3.51 MIL/uL — ABNORMAL LOW (ref 3.87–5.11)
RDW: 15.6 % — ABNORMAL HIGH (ref 11.5–15.5)
WBC: 9 10*3/uL (ref 4.0–10.5)
nRBC: 0.2 % (ref 0.0–0.2)

## 2022-05-13 LAB — GLUCOSE, CAPILLARY
Glucose-Capillary: 87 mg/dL (ref 70–99)
Glucose-Capillary: 80 mg/dL (ref 70–99)
Glucose-Capillary: 127 mg/dL — ABNORMAL HIGH (ref 70–99)
Glucose-Capillary: 85 mg/dL (ref 70–99)

## 2022-05-13 MED ORDER — TERBUTALINE SULFATE 1 MG/ML IJ SOLN
0.2500 mg | Freq: Once | INTRAMUSCULAR | Status: DC | PRN
  Filled 2022-05-13: qty 1

## 2022-05-13 MED ORDER — NIFEDIPINE ER OSMOTIC RELEASE 30 MG PO TB24
30.0000 mg | ORAL_TABLET | Freq: Every day | ORAL | Status: DC
  Administered 2022-05-13: 30 mg via ORAL
  Filled 2022-05-13: qty 1

## 2022-05-13 MED ORDER — MISOPROSTOL 25 MCG QUARTER TABLET
25.0000 ug | ORAL_TABLET | ORAL | Status: DC | PRN
  Administered 2022-05-13 – 2022-05-14 (×3): 25 ug via VAGINAL
  Filled 2022-05-13 (×3): qty 1

## 2022-05-13 MED ORDER — MAGNESIUM SULFATE BOLUS VIA INFUSION
4.0000 g | Freq: Once | INTRAVENOUS | Status: AC
  Administered 2022-05-13: 4 g via INTRAVENOUS
  Filled 2022-05-13: qty 1000

## 2022-05-13 MED ORDER — LACTATED RINGERS IV SOLN: INTRAVENOUS | Status: DC

## 2022-05-13 MED ORDER — OXYTOCIN-SODIUM CHLORIDE 30-0.9 UT/500ML-% IV SOLN
2.5000 [IU]/h | INTRAVENOUS | Status: DC
  Filled 2022-05-13: qty 500

## 2022-05-13 MED ORDER — SOD CITRATE-CITRIC ACID 500-334 MG/5ML PO SOLN: 30.0000 mL | ORAL | Status: DC | PRN

## 2022-05-13 MED ORDER — ZOLPIDEM TARTRATE 5 MG PO TABS: 5.0000 mg | ORAL_TABLET | Freq: Every evening | ORAL | Status: DC | PRN

## 2022-05-13 MED ORDER — MAGNESIUM SULFATE 40 GM/1000ML IV SOLN
2.0000 g/h | INTRAVENOUS | Status: AC
  Administered 2022-05-14 – 2022-05-15 (×2): 2 g/h via INTRAVENOUS
  Filled 2022-05-13 (×3): qty 1000

## 2022-05-13 MED ORDER — LIDOCAINE HCL (PF) 1 % IJ SOLN: 30.0000 mL | INTRAMUSCULAR | Status: DC | PRN

## 2022-05-13 MED ORDER — ACETAMINOPHEN 325 MG PO TABS
650.0000 mg | ORAL_TABLET | ORAL | Status: DC | PRN
  Administered 2022-05-13 – 2022-05-14 (×3): 650 mg via ORAL
  Filled 2022-05-13 (×3): qty 2

## 2022-05-13 MED ORDER — FENTANYL CITRATE (PF) 100 MCG/2ML IJ SOLN
50.0000 ug | INTRAMUSCULAR | Status: DC | PRN
  Administered 2022-05-13: 100 ug via INTRAVENOUS
  Filled 2022-05-13: qty 2

## 2022-05-13 MED ORDER — HYDROXYZINE HCL 50 MG PO TABS: 50.0000 mg | ORAL_TABLET | Freq: Four times a day (QID) | ORAL | Status: DC | PRN

## 2022-05-13 MED ORDER — METOCLOPRAMIDE HCL 10 MG PO TABS
10.0000 mg | ORAL_TABLET | Freq: Once | ORAL | Status: AC
  Administered 2022-05-13: 10 mg via ORAL
  Filled 2022-05-13: qty 1

## 2022-05-13 MED ORDER — LACTATED RINGERS IV SOLN
500.0000 mL | INTRAVENOUS | Status: DC | PRN
  Administered 2022-05-14 (×2): 500 mL via INTRAVENOUS

## 2022-05-13 MED ORDER — METOCLOPRAMIDE HCL 5 MG/ML IJ SOLN
10.0000 mg | Freq: Once | INTRAMUSCULAR | Status: AC
  Administered 2022-05-14: 10 mg via INTRAVENOUS
  Filled 2022-05-13: qty 2

## 2022-05-13 MED ORDER — LACTATED RINGERS IV SOLN: INTRAVENOUS | Status: AC

## 2022-05-13 MED ORDER — OXYTOCIN-SODIUM CHLORIDE 30-0.9 UT/500ML-% IV SOLN
1.0000 m[IU]/min | INTRAVENOUS | Status: DC
  Filled 2022-05-13: qty 500

## 2022-05-13 MED ORDER — OXYTOCIN BOLUS FROM INFUSION: 333.0000 mL | Freq: Once | INTRAVENOUS | Status: DC

## 2022-05-13 NOTE — Progress Notes (Addendum)
S: Feeling "so much better". Reports complete resolution of HA yesterday and it only returned after taking procardia. Then was resolved with tylenol.Was able to sleep and this am feels back to baseline.  Low dose procardia was started because of some persistent mild range Bps - no severe ranges at all.  Denies s/s severe disease. + FM. No contractions or LOF.   O:  Vitals:   05/12/22 2318 05/13/22 0447  BP: (!) 145/92 134/89  Pulse: 60 (!) 55  Resp: 18 17  Temp:  98.5 F (36.9 C)  SpO2: 100% 100%   NAD, A&O NWOB Abd soft, nondistended, gravid Neuro WNL  A/P: 27 yo G1P0 with PIH without severe features admitted for observation.   - Bps normal to moderate range - never has had any severe range - normal on procardia 30mg , ctm BPs - PIH labs WNL except UPC - continue daily labs - NST q shift - Continue metformin and SSI - appreciate DM coordinator recs - BS fasting and 2 hours pp every meal - next growth due 05/16/22 - GBS negative  Belva Agee MD

## 2022-05-13 NOTE — Progress Notes (Signed)
Did well until late AM. HA returned. Not responsive to 1000mg  of tylenol and oxy. Then Bps became severe range and she required one dose of IV hydral. IV Mag was started for [facility] with severe features.  Since Mag started, she is feeling better. HA resolved. Bps improved. She denies blurry vision, SOB, CP, etc. +FM.  D/w patient and husband severe PIH and the risks. I recommend delivery given > 34 wga.  SVE ftp/50/-2, vertex Proceed with cytotec IOL gvien unfavorable cervix. Followed by pitocin/arom when more favorable. Discussed the typical IOL process.    - continue procardia (will hold if Bps <100/70) - cefm - continue IV Mag - Continue metformin  - BS per L&D protocol, IV insulin prn - GBS negative   Rosie Fate MD

## 2022-05-13 NOTE — Progress Notes (Signed)
Doing well.  HA better still.  Doing well on IV Mag.  Cytotec IOL.    Rosie Fate MD

## 2022-05-14 ENCOUNTER — Encounter (HOSPITAL_COMMUNITY): Payer: Self-pay | Admitting: Obstetrics and Gynecology

## 2022-05-14 ENCOUNTER — Inpatient Hospital Stay (HOSPITAL_COMMUNITY): Payer: BC Managed Care – PPO | Admitting: Anesthesiology

## 2022-05-14 ENCOUNTER — Encounter: Payer: Self-pay | Admitting: Obstetrics and Gynecology

## 2022-05-14 LAB — CBC
HCT: 31.9 % — ABNORMAL LOW (ref 36.0–46.0)
Hemoglobin: 10.7 g/dL — ABNORMAL LOW (ref 12.0–15.0)
MCH: 26.1 pg (ref 26.0–34.0)
MCHC: 33.5 g/dL (ref 30.0–36.0)
MCV: 77.8 fL — ABNORMAL LOW (ref 80.0–100.0)
Platelets: 247 10*3/uL (ref 150–400)
RBC: 4.1 MIL/uL (ref 3.87–5.11)
RDW: 15.7 % — ABNORMAL HIGH (ref 11.5–15.5)
WBC: 15.3 10*3/uL — ABNORMAL HIGH (ref 4.0–10.5)
nRBC: 0 % (ref 0.0–0.2)

## 2022-05-14 LAB — COMPREHENSIVE METABOLIC PANEL
ALT: 15 U/L (ref 0–44)
AST: 19 U/L (ref 15–41)
Albumin: 2.3 g/dL — ABNORMAL LOW (ref 3.5–5.0)
Alkaline Phosphatase: 98 U/L (ref 38–126)
Anion gap: 11 (ref 5–15)
BUN: 11 mg/dL (ref 6–20)
CO2: 21 mmol/L — ABNORMAL LOW (ref 22–32)
Calcium: 7.5 mg/dL — ABNORMAL LOW (ref 8.9–10.3)
Chloride: 102 mmol/L (ref 98–111)
Creatinine, Ser: 0.87 mg/dL (ref 0.44–1.00)
GFR, Estimated: >60 mL/min (ref >60)
Glucose, Bld: 82 mg/dL (ref 70–99)
Potassium: 3.6 mmol/L (ref 3.5–5.1)
Sodium: 134 mmol/L — ABNORMAL LOW (ref 135–145)
Total Bilirubin: 0.5 mg/dL (ref 0.3–1.2)
Total Protein: 5.6 g/dL — ABNORMAL LOW (ref 6.5–8.1)

## 2022-05-14 LAB — RPR: RPR Ser Ql: NONREACTIVE

## 2022-05-14 LAB — GLUCOSE, CAPILLARY
Glucose-Capillary: 106 mg/dL — ABNORMAL HIGH (ref 70–99)
Glucose-Capillary: 106 mg/dL — ABNORMAL HIGH (ref 70–99)
Glucose-Capillary: 114 mg/dL — ABNORMAL HIGH (ref 70–99)
Glucose-Capillary: 129 mg/dL — ABNORMAL HIGH (ref 70–99)
Glucose-Capillary: 154 mg/dL — ABNORMAL HIGH (ref 70–99)
Glucose-Capillary: 84 mg/dL (ref 70–99)

## 2022-05-14 LAB — MAGNESIUM: Magnesium: 5.6 mg/dL — ABNORMAL HIGH (ref 1.7–2.4)

## 2022-05-14 MED ORDER — EPHEDRINE 5 MG/ML INJ
10.0000 mg | INTRAVENOUS | Status: DC | PRN
  Administered 2022-05-14: 10 mg via INTRAVENOUS

## 2022-05-14 MED ORDER — DIPHENHYDRAMINE HCL 50 MG/ML IJ SOLN: 12.5000 mg | INTRAMUSCULAR | Status: DC | PRN

## 2022-05-14 MED ORDER — SENNOSIDES-DOCUSATE SODIUM 8.6-50 MG PO TABS
2.0000 | ORAL_TABLET | Freq: Every day | ORAL | Status: DC
  Administered 2022-05-15 – 2022-05-18 (×3): 2 via ORAL
  Filled 2022-05-14 (×5): qty 2

## 2022-05-14 MED ORDER — INSULIN ASPART 100 UNIT/ML IJ SOLN
0.0000 [IU] | INTRAMUSCULAR | Status: DC
  Administered 2022-05-14 (×2): 1 [IU] via SUBCUTANEOUS
  Administered 2022-05-14: 2 [IU] via SUBCUTANEOUS

## 2022-05-14 MED ORDER — PHENYLEPHRINE 80 MCG/ML (10ML) SYRINGE FOR IV PUSH (FOR BLOOD PRESSURE SUPPORT)
80.0000 ug | PREFILLED_SYRINGE | INTRAVENOUS | Status: DC | PRN
  Filled 2022-05-14: qty 10

## 2022-05-14 MED ORDER — FENTANYL-BUPIVACAINE-NACL 0.5-0.125-0.9 MG/250ML-% EP SOLN
12.0000 mL/h | EPIDURAL | Status: DC | PRN
  Administered 2022-05-14: 12 mL/h via EPIDURAL
  Filled 2022-05-14: qty 250

## 2022-05-14 MED ORDER — ONDANSETRON HCL 4 MG PO TABS: 4.0000 mg | ORAL_TABLET | ORAL | Status: DC | PRN

## 2022-05-14 MED ORDER — PHENYLEPHRINE 80 MCG/ML (10ML) SYRINGE FOR IV PUSH (FOR BLOOD PRESSURE SUPPORT)
80.0000 ug | PREFILLED_SYRINGE | INTRAVENOUS | Status: AC | PRN
  Administered 2022-05-14 (×3): 80 ug via INTRAVENOUS

## 2022-05-14 MED ORDER — OXYCODONE HCL 5 MG PO TABS: 5.0000 mg | ORAL_TABLET | ORAL | Status: DC | PRN

## 2022-05-14 MED ORDER — WITCH HAZEL-GLYCERIN EX PADS: 1.0000 | MEDICATED_PAD | CUTANEOUS | Status: DC | PRN

## 2022-05-14 MED ORDER — BENZOCAINE-MENTHOL 20-0.5 % EX AERO
1.0000 | INHALATION_SPRAY | CUTANEOUS | Status: DC | PRN
  Administered 2022-05-15 – 2022-05-17 (×2): 1 via TOPICAL
  Filled 2022-05-14 (×2): qty 56

## 2022-05-14 MED ORDER — TERBUTALINE SULFATE 1 MG/ML IJ SOLN
0.2500 mg | Freq: Once | INTRAMUSCULAR | Status: AC | PRN
  Administered 2022-05-14: 0.25 mg via SUBCUTANEOUS

## 2022-05-14 MED ORDER — ACETAMINOPHEN 325 MG PO TABS
650.0000 mg | ORAL_TABLET | ORAL | Status: DC | PRN
  Administered 2022-05-15: 650 mg via ORAL
  Filled 2022-05-14: qty 2

## 2022-05-14 MED ORDER — ZOLPIDEM TARTRATE 5 MG PO TABS: 5.0000 mg | ORAL_TABLET | Freq: Every evening | ORAL | Status: DC | PRN

## 2022-05-14 MED ORDER — OXYTOCIN-SODIUM CHLORIDE 30-0.9 UT/500ML-% IV SOLN
1.0000 m[IU]/min | INTRAVENOUS | Status: DC
  Administered 2022-05-14: 2 m[IU]/min via INTRAVENOUS

## 2022-05-14 MED ORDER — ONDANSETRON HCL 4 MG/2ML IJ SOLN
INTRAMUSCULAR | Status: AC
  Administered 2022-05-14: 4 mg
  Filled 2022-05-14: qty 2

## 2022-05-14 MED ORDER — COCONUT OIL OIL
1.0000 | TOPICAL_OIL | Status: DC | PRN
  Administered 2022-05-15: 1 via TOPICAL

## 2022-05-14 MED ORDER — EPHEDRINE 5 MG/ML INJ
10.0000 mg | INTRAVENOUS | Status: DC | PRN
  Filled 2022-05-14: qty 5

## 2022-05-14 MED ORDER — TETANUS-DIPHTH-ACELL PERTUSSIS 5-2.5-18.5 LF-MCG/0.5 IM SUSY: 0.5000 mL | PREFILLED_SYRINGE | Freq: Once | INTRAMUSCULAR | Status: DC

## 2022-05-14 MED ORDER — ONDANSETRON HCL 4 MG/2ML IJ SOLN: 4.0000 mg | INTRAMUSCULAR | Status: DC | PRN

## 2022-05-14 MED ORDER — NIFEDIPINE ER OSMOTIC RELEASE 30 MG PO TB24
30.0000 mg | ORAL_TABLET | Freq: Every day | ORAL | Status: DC
  Administered 2022-05-15 – 2022-05-16 (×3): 30 mg via ORAL
  Filled 2022-05-14 (×3): qty 1

## 2022-05-14 MED ORDER — PRENATAL MULTIVITAMIN CH
1.0000 | ORAL_TABLET | Freq: Every day | ORAL | Status: DC
  Administered 2022-05-15 – 2022-05-19 (×5): 1 via ORAL
  Filled 2022-05-14 (×5): qty 1

## 2022-05-14 MED ORDER — IBUPROFEN 600 MG PO TABS
600.0000 mg | ORAL_TABLET | Freq: Four times a day (QID) | ORAL | Status: DC
  Administered 2022-05-15 – 2022-05-19 (×16): 600 mg via ORAL
  Filled 2022-05-14 (×18): qty 1

## 2022-05-14 MED ORDER — LACTATED RINGERS IV SOLN: 500.0000 mL | Freq: Once | INTRAVENOUS | Status: AC

## 2022-05-14 MED ORDER — LIDOCAINE-EPINEPHRINE (PF) 2 %-1:200000 IJ SOLN
INTRAMUSCULAR | Status: DC | PRN
  Administered 2022-05-14: 5 mL via EPIDURAL

## 2022-05-14 MED ORDER — LACTATED RINGERS IV SOLN
500.0000 mL | Freq: Once | INTRAVENOUS | Status: AC
  Administered 2022-05-14: 250 mL via INTRAVENOUS

## 2022-05-14 MED ORDER — SIMETHICONE 80 MG PO CHEW: 80.0000 mg | CHEWABLE_TABLET | ORAL | Status: DC | PRN

## 2022-05-14 MED ORDER — DIPHENHYDRAMINE HCL 25 MG PO CAPS: 25.0000 mg | ORAL_CAPSULE | Freq: Four times a day (QID) | ORAL | Status: DC | PRN

## 2022-05-14 MED ORDER — DIBUCAINE (PERIANAL) 1 % EX OINT: 1.0000 | TOPICAL_OINTMENT | CUTANEOUS | Status: DC | PRN

## 2022-05-14 NOTE — Progress Notes (Addendum)
Patient vomiting, otherwise no preE symtpoms. Chest discomfort resolved. Contractions adequate on pit. FHT overall more reassuring, however we discussed that her cervix is unchanged, now with some swelling developing along anterior aspect and increased caput. We discussed that although she does not technically meet criteria for an arrest disorder, I am worried about her progression. In addition, we discussed the increased risk of PPH in the setting of magnesium and prolonged labor. They would like to discuss first and we will reassess.  Jule Economy, MD

## 2022-05-14 NOTE — Progress Notes (Signed)
Labor Progress Note - Delayed Entry  Patient feeling overall well, some fatigue. No preE symptoms. Mag level therapeutic. Cervix checked,  6/90/-2 with some caput.   During position changes immediately after cervical exam, patient with mild vagal response - became hypotensive to 90s/60s and feeling some dizziness. Upon lying on side, fluid bolus, and administration of phenylephrine, symptoms resolved immediately. FHT with three deep variables to the 70s. Pit discontinued. Returned to baseline with position changes.   We again discussed that this was likely secondary to hypotensive episode. However, will closely monitor - again briefly discussed if recurrent, potential for c-section.  All questions answered.  Jule Economy, MD

## 2022-05-14 NOTE — Progress Notes (Signed)
Labor Progress Note  HA improved. Does report she had nausea after her last few doses of pain medications. Bps have been mostly normal. Continue Procardia and Mag for severe preeclampsia. No vision changes, RUQ pain. No hyperreflexia.  Cervix 3/50/-2, AROM after discussion with patient and partner with clear fluid FHT cat 1 Plan to start pitocin when able (last dose of cytotec around 0500) Patient desires epidural.  Jule Economy, MD

## 2022-05-14 NOTE — Progress Notes (Signed)
Feeling significant pelvic pressure. C/0 station. Will start pushing!

## 2022-05-14 NOTE — Anesthesia Preprocedure Evaluation (Signed)
Anesthesia Evaluation  Patient identified by MRN, date of birth, ID band Patient awake    Reviewed: Allergy & Precautions, Patient's Chart, lab work & pertinent test results  Airway Mallampati: II       Dental no notable dental hx.    Pulmonary    Pulmonary exam normal        Cardiovascular hypertension, Normal cardiovascular exam     Neuro/Psych    GI/Hepatic ,GERD  Medicated,,  Endo/Other  diabetes, Type 2, Oral Hypoglycemic Agents    Renal/GU      Musculoskeletal   Abdominal   Peds  Hematology   Anesthesia Other Findings   Reproductive/Obstetrics (+) Pregnancy                              Anesthesia Physical Anesthesia Plan  ASA: 2  Anesthesia Plan: Epidural   Post-op Pain Management: Minimal or no pain anticipated   Induction:   PONV Risk Score and Plan: 0  Airway Management Planned: Natural Airway  Additional Equipment: None  Intra-op Plan:   Post-operative Plan:   Informed Consent: I have reviewed the patients History and Physical, chart, labs and discussed the procedure including the risks, benefits and alternatives for the proposed anesthesia with the patient or authorized representative who has indicated his/her understanding and acceptance.       Plan Discussed with:   Anesthesia Plan Comments:          Anesthesia Quick Evaluation

## 2022-05-14 NOTE — Progress Notes (Addendum)
Labor Progress Note  Called to room for prolonged deceleration after epidural placement. Patient getting ephedrine dose for hypotension as I entered. Lying on left side with intermittent improvement in FHR, however second prolonged deceleration to 70s. IM terb given with good recovery. Cervix checked, 4/80/-2. I discussed with the patient that this was likely secondary to acute hypotensive episode after epidural - however we did discuss that if recurrent nonreassuring FHT, would recommend a c-section. IUPC was placed. Plan to start pitocin if FHT reassuring after .  Prior to epidural placement, patient did report episode of lightheadedness and mild difficulty speaking. Per RN report, patient was speaking at her baseline during this episode. Currently asymptomatic, feeling much improved. Will check mag level.  Jule Economy, MD

## 2022-05-14 NOTE — Progress Notes (Signed)
Labor Progress Note  Called by RN to see patient for chest discomfort - upon entry into room, patient burped and reports "feeling better". O2 sat 100%. HR wnl. Patient in no acute distress, no increased work of breathing. No chest pain, shortness of breath. Lungs clear to auscultation bilaterally.  I discussed with the patient that FHT intermittently not reassuring with minimal variability and some late decelerations. Will plan to check her cervix again and reassess, but she and her partner understand that a c-section would be indicated if persistent NRFHT/lack of cervical change.  Jule Economy, MD

## 2022-05-14 NOTE — Anesthesia Procedure Notes (Signed)
Epidural Patient location during procedure: OB Start time: 05/14/2022 9:26 AM End time: 05/14/2022 9:32 AM  Staffing Anesthesiologist: Shelton Silvas, MD Performed: anesthesiologist   Preanesthetic Checklist Completed: patient identified, IV checked, site marked, risks and benefits discussed, surgical consent, monitors and equipment checked, pre-op evaluation and timeout performed  Epidural Patient position: sitting Prep: DuraPrep Patient monitoring: heart rate, continuous pulse ox and blood pressure Approach: midline Location: L3-L4 Injection technique: LOR saline  Needle:  Needle type: Tuohy  Needle gauge: 17 G Needle length: 9 cm Catheter type: closed end flexible Catheter size: 20 Guage Test dose: negative and 1.5% lidocaine  Assessment Events: blood not aspirated, no cerebrospinal fluid, injection not painful, no injection resistance and no paresthesia  Additional Notes LOR @ 4  Patient identified. Risks/Benefits/Options discussed with patient including but not limited to bleeding, infection, nerve damage, paralysis, failed block, incomplete pain control, headache, blood pressure changes, nausea, vomiting, reactions to medications, itching and postpartum back pain. Confirmed with bedside nurse the patient's most recent platelet count. Confirmed with patient that they are not currently taking any anticoagulation, have any bleeding history or any family history of bleeding disorders. Patient expressed understanding and wished to proceed. All questions were answered. Sterile technique was used throughout the entire procedure. Please see nursing notes for vital signs. Test dose was given through epidural catheter and negative prior to continuing to dose epidural or start infusion. Warning signs of high block given to the patient including shortness of breath, tingling/numbness in hands, complete motor block, or any concerning symptoms with instructions to call for help. Patient was  given instructions on fall risk and not to get out of bed. All questions and concerns addressed with instructions to call with any issues or inadequate analgesia.    Reason for block:procedure for pain

## 2022-05-15 LAB — CBC
HCT: 26.1 % — ABNORMAL LOW (ref 36.0–46.0)
HCT: 27.5 % — ABNORMAL LOW (ref 36.0–46.0)
Hemoglobin: 8.6 g/dL — ABNORMAL LOW (ref 12.0–15.0)
Hemoglobin: 9 g/dL — ABNORMAL LOW (ref 12.0–15.0)
MCH: 26 pg (ref 26.0–34.0)
MCH: 26 pg (ref 26.0–34.0)
MCHC: 32.7 g/dL (ref 30.0–36.0)
MCHC: 33 g/dL (ref 30.0–36.0)
MCV: 78.9 fL — ABNORMAL LOW (ref 80.0–100.0)
MCV: 79.5 fL — ABNORMAL LOW (ref 80.0–100.0)
Platelets: 207 10*3/uL (ref 150–400)
Platelets: 233 10*3/uL (ref 150–400)
RBC: 3.31 MIL/uL — ABNORMAL LOW (ref 3.87–5.11)
RBC: 3.46 MIL/uL — ABNORMAL LOW (ref 3.87–5.11)
RDW: 15.9 % — ABNORMAL HIGH (ref 11.5–15.5)
RDW: 15.9 % — ABNORMAL HIGH (ref 11.5–15.5)
WBC: 19.3 10*3/uL — ABNORMAL HIGH (ref 4.0–10.5)
WBC: 22.5 10*3/uL — ABNORMAL HIGH (ref 4.0–10.5)
nRBC: 0 % (ref 0.0–0.2)
nRBC: 0 % (ref 0.0–0.2)

## 2022-05-15 LAB — GLUCOSE, CAPILLARY: Glucose-Capillary: 101 mg/dL — ABNORMAL HIGH (ref 70–99)

## 2022-05-15 MED ORDER — INSULIN ASPART 100 UNIT/ML IJ SOLN: 0.0000 [IU] | Freq: Three times a day (TID) | INTRAMUSCULAR | Status: DC

## 2022-05-15 NOTE — Progress Notes (Signed)
Post Partum Day 1 Subjective: no complaints, voiding, and tolerating PO No PIH sxs, fatigue on Mag  Objective: Blood pressure 123/83, pulse 78, temperature 98.1 F (36.7 C), temperature source Oral, resp. rate 18, height  (1.676 m), weight 99.8 kg, last menstrual period 09/05/2021, SpO2 100 %, unknown if currently breastfeeding.  Physical Exam:  General: alert, cooperative, appears stated age, and no distress Lochia: appropriate Uterine Fundus: firm Incision:   DVT Evaluation: No evidence of DVT seen on physical exam.  Recent Labs    05/14/22 2333 05/15/22 0307  HGB 9.0* 8.6*  HCT 27.5* 26.1*    Assessment/Plan: Breastfeeding PIH - BPs stable on meds.  Magnesium x 24 hours Stable labs A2DM - glc levels appropriate without meds   LOS: 3 days   Turner Daniels, MD 05/15/2022, 10:23 AM

## 2022-05-15 NOTE — Anesthesia Postprocedure Evaluation (Signed)
Anesthesia Post Note  Patient: Stacy Black  Procedure(s) Performed: AN AD HOC LABOR EPIDURAL     Patient location during evaluation: OB High Risk Anesthesia Type: Epidural Level of consciousness: awake, oriented and awake and alert Pain management: pain level controlled Vital Signs Assessment: post-procedure vital signs reviewed and stable Respiratory status: spontaneous breathing, respiratory function stable and nonlabored ventilation Cardiovascular status: stable Postop Assessment: no headache, adequate PO intake, able to ambulate, patient able to bend at knees and no apparent nausea or vomiting Anesthetic complications: no   No notable events documented.  Last Vitals:  Vitals:   05/15/22 0750 05/15/22 0850  BP: 123/83   Pulse: 78   Resp: 17 16  Temp: 36.7 C   SpO2: 100%     Last Pain:  Vitals:   05/15/22 0850  TempSrc:   PainSc: 4    Pain Goal: Patients Stated Pain Goal: 0 (05/15/22 0750)                 Merrillyn Ackerley

## 2022-05-15 NOTE — Lactation Note (Signed)
This note was copied from a baby's chart. Lactation Consultation Note  Patient Name: Stacy Black [id number] Date: 05/15/2022 Age:29 hours Reason for consult: Initial assessment;Primapara;Late-preterm 34-36.6wks;Infant < 6lbs Assisted baby to the breast to BF. Has low glucose and needs to feed. Baby had no interest in BF. Mom used DEBP that LC set up. LC gave the baby the rest of the [facility] that was left in the bottle that she didn't finish. Mom pumped about 1 ml. LC attempted to give to baby but she wasn't interested and wouldn't take it. Wouldn't suckle on gloved finger. Discussed pumping w/mom. Mom doesn't have a DEBP yet. Mom stated she didn't have everything yet because the baby was earlier.  Discussed reasoning for pump and supplementing and how to supplement giving any BM first.  Mom stated having increase in breast size and leaking a little bit of colostrum. More breast tissue to outer breast than inner.  LPI is given by RN as well as supplemental guidelines amounts.  LPI feeding habits, behavior, STS, I&O, positioning, support. Mom is sleepy after pumping. Encouraged mom to wake baby for feedings if hasn't cued in 3 hrs. Call for assistance as needed.   Maternal Data    Feeding Nipple Type: Nfant Slow Flow (purple)  LATCH Score Latch: Too sleepy or reluctant, no latch achieved, no sucking elicited.  Audible Swallowing: None  Type of Nipple: Everted at rest and after stimulation  Comfort (Breast/Nipple): Soft / non-tender  Hold (Positioning): Full assist, staff holds infant at breast  LATCH Score: 4   Lactation Tools Discussed/Used Tools: Pump Breast pump type: Double-Electric Breast Pump Pump Education: Setup, frequency, and cleaning;Milk Storage Reason for Pumping: LPI Pumping frequency: q3hr Pumped volume: 1 mL  Interventions Interventions: Breast feeding basics reviewed;Adjust position;DEBP;Assisted with latch;Support pillows;Skin to skin;Position  options;Education;Breast massage;Expressed milk;Pace feeding;Hand express;LC Services brochure;Breast compression;LPT handout/interventions  Discharge    Consult Status Consult Status: Follow-up Date: 05/15/22 Follow-up type: In-patient    Charyl Dancer 05/15/2022, 1:36 AM

## 2022-05-15 NOTE — Lactation Note (Addendum)
This note was copied from a baby's chart. Lactation Consultation Note  Patient Name: Stacy Black [id number] Date: 05/15/2022 Age:29 hours Reason for consult: Follow-up assessment;Primapara;1st time breastfeeding;Late-preterm 34-36.6wks;Infant < 6lbs;Maternal endocrine disorder;Other (Comment) (Gestational Hypertension on MgSO4)  LC in to visit with P1 Mom of LPTI delivered vaginally.  Mom currently has baby sleeping STS on her chest.  Mom has attempted to breastfeed, but baby hasn't shown any feeding cues yet. LC reviewed breast massage and hand expression for a drop of colostrum.  Noted breasts to be small and widely spaced, will continue to monitor milk supply.  Mom does report + breast changes with this pregnancy.   Assisted Mom to do her second pumping using 21 flanges.  Hand's free pumping band in room and RN will assist once Mom's IVs are DC'd.  LC educated FOB to feed baby in side lying position to pace feed baby and she did very well taking in 14 ml donor milk.    Reviewed cleaning process and 2nd bin provided for washing pump parts.   Plan recommended- (plan written on dry erase board) 1- STS with baby as much as possible 2- Offer the breast with feeding cues, ask for help prn 3-Every 3 hrs or sooner if baby cueing, supplement by paced bottle of EBM+/donor breast milk (crib card for LPTI with volumes) 4- Pump both breasts 15 mins on initiation setting  Maternal Data Has patient been taught Hand Expression?: Yes Does the patient have breastfeeding experience prior to this delivery?: No  Feeding Mother's Current Feeding Choice: Breast Milk and Donor Milk   Lactation Tools Discussed/Used Tools: Pump;Flanges;Hands-free pumping top Flange Size: 21 Breast pump type: Double-Electric Breast Pump Pump Education: Setup, frequency, and cleaning;Milk Storage Reason for Pumping: Support milk supply/LPTI/<6 lbs Pumping frequency: Mom has pumped once, encouraged more consistent  pumping Pumped volume: 0 mL (drops)  Interventions Interventions: Breast feeding basics reviewed;Skin to skin;Breast massage;Hand express;Education;Pace feeding;DEBP;Coconut oil  Discharge Pump:  (LC submittetd a STORK pump request) WIC Program: No  Consult Status Consult Status: Follow-up Date: 05/16/22 Follow-up type: In-patient    Judee Clara 05/15/2022, 10:16 AM

## 2022-05-16 MED ORDER — DOCUSATE SODIUM 100 MG PO CAPS
100.0000 mg | ORAL_CAPSULE | Freq: Every day | ORAL | Status: DC
  Administered 2022-05-16 – 2022-05-18 (×3): 100 mg via ORAL
  Filled 2022-05-16 (×4): qty 1

## 2022-05-16 MED ORDER — FERROUS SULFATE 325 (65 FE) MG PO TABS
325.0000 mg | ORAL_TABLET | ORAL | Status: DC
  Administered 2022-05-16 – 2022-05-18 (×2): 325 mg via ORAL
  Filled 2022-05-16 (×2): qty 1

## 2022-05-16 MED ORDER — ACETAMINOPHEN 325 MG PO TABS: 650.0000 mg | ORAL_TABLET | ORAL | 0 refills | Status: AC | PRN

## 2022-05-16 MED ORDER — DOCUSATE SODIUM 100 MG PO CAPS: 100.0000 mg | ORAL_CAPSULE | Freq: Every day | ORAL | 0 refills | Status: AC

## 2022-05-16 MED ORDER — FERROUS SULFATE 325 (65 FE) MG PO TABS: 325.0000 mg | ORAL_TABLET | ORAL | 3 refills | Status: AC

## 2022-05-16 MED ORDER — IBUPROFEN 600 MG PO TABS: 600.0000 mg | ORAL_TABLET | Freq: Four times a day (QID) | ORAL | 0 refills | Status: AC

## 2022-05-16 MED ORDER — NIFEDIPINE ER 30 MG PO TB24: 30.0000 mg | ORAL_TABLET | Freq: Every day | ORAL | 1 refills | Status: AC

## 2022-05-16 NOTE — Lactation Note (Signed)
This note was copied from a baby's chart. Lactation Consultation Note  Patient Name: Stacy Black Date: 05/16/2022 Age:29 hours Reason for consult: Follow-up assessment;Primapara;Infant < 6lbs;Late-preterm 34-36.6wks Baby on DPT. Grandparents in rm holding baby while on lights. Mom stated baby is latching to breast and then she is supplementing w/20-30 ml DBM. Praised mom. Asked mom to call for assistance as needed.  Maternal Data    Feeding Mother's Current Feeding Choice: Breast Milk and Donor Milk Nipple Type: Extra Slow Flow  LATCH Score                    Lactation Tools Discussed/Used    Interventions    Discharge    Consult Status Consult Status: Follow-up Date: 05/17/22 Follow-up type: In-patient    Charyl Dancer 05/16/2022, 8:22 PM

## 2022-05-16 NOTE — Progress Notes (Signed)
Postpartum Progress Note  Post Partum Day 2 s/p spontaneous vaginal delivery.  Patient reports well-controlled pain, ambulating without difficulty, voiding spontaneously, tolerating PO.  Vaginal bleeding is appropriate.  She feels much improved since discontinuation of magnesium. Diuresing well.    Objective: Blood pressure 131/84, pulse 81, temperature 98.3 F (36.8 C), temperature source Oral, resp. rate 17, height  (1.676 m), weight 99.8 kg, last menstrual period 09/05/2021, SpO2 100 %, unknown if currently breastfeeding.  Physical Exam:  General: alert and no distress Lochia: appropriate Uterine Fundus: firm DVT Evaluation: No evidence of DVT seen on physical exam.  Recent Labs    05/14/22 2333 05/15/22 0307  HGB 9.0* 8.6*  HCT 27.5* 26.1*    Assessment/Plan: Postpartum Day 2, s/p vaginal delivery. Preeclampsia - s/p postpartum magnesium BP controlled on 30 mg procardia XL QHS Acute blood loss anemia - Fe/Colace. No signs or symptoms of anemia.  Continue routine postpartum care Lactation following Anticipate discharge home today   LOS: 4 days   Stacy Black 05/16/2022, 7:28 AM

## 2022-05-16 NOTE — Progress Notes (Signed)
Told patient that she had a discharge order written and explained procedure for rooming-in since baby is not being discharged today.  Patient said that Dr. Lorane Gell gave her the option to not be discharged until tomorrow morning and she would prefer to remain in-patient.  Phoned Dr. Lorane Gell with patient request.  Received verbal order to discontinue discharge.  Will continue to monitor blood pressures and MD will reassess disposition tomorrow morning.

## 2022-05-17 MED ORDER — NIFEDIPINE ER OSMOTIC RELEASE 60 MG PO TB24
60.0000 mg | ORAL_TABLET | Freq: Every day | ORAL | Status: DC
  Administered 2022-05-17: 60 mg via ORAL
  Filled 2022-05-17: qty 1

## 2022-05-17 NOTE — Progress Notes (Signed)
PPD # 3  Doing fine. Baby on bili blanket in the room with her.  No headache.  BP (!) 141/92 (BP Location: Right Arm)   Pulse 78   Temp 98.3 F (36.8 C) (Oral)   Resp 17   Ht  (1.676 m)   Wt 99.8 kg   LMP 09/05/2021   SpO2 98%   Breastfeeding Unknown   BMI 35.51 kg/m  No results found for this or any previous visit (from the past 24 hour(s)). Uterus is firm and non tender Lochia WNL  PPD # 3  Preeclampsia - status post magnesium BP still somewhat elevated Increase procardia to 60 mg po ad  Consider discharge home tomorrow

## 2022-05-17 NOTE — Plan of Care (Signed)
  Problem: Activity: Goal: Will verbalize the importance of balancing activity with adequate rest periods Outcome: Progressing Goal: Ability to tolerate increased activity will improve Outcome: Progressing   Problem: Coping: Goal: Ability to identify and utilize available resources and services will improve Outcome: Progressing   Problem: Life Cycle: Goal: Chance of risk for complications during the postpartum period will decrease Outcome: Progressing   Problem: Role Relationship: Goal: Ability to demonstrate positive interaction with newborn will improve Outcome: Progressing   Problem: Skin Integrity: Goal: Demonstration of wound healing without infection will improve Outcome: Progressing   

## 2022-05-17 NOTE — Lactation Note (Signed)
This note was copied from a baby's chart. Lactation Consultation Note  Patient Name: Stacy Black [id number] Date: 05/17/2022 Age:29 hours Reason for consult: Follow-up assessment;1st time breastfeeding;Late-preterm 34-36.6wks;Hyperbilirubinemia;Infant < 6lbs  P1, Baby [redacted]w[redacted]d on phototherapy.  Mother states she is breastfeeding and supplementing with donor milk.  Mother states she has pumped once today.  Suggest pumping q 3 hours.  Stork pump Spectra in room.  Mother will call for help as needed.  Maternal Data Has patient been taught Hand Expression?: Yes  Feeding Mother's Current Feeding Choice: Breast Milk Nipple Type: Extra Slow Flow  Lactation Tools Discussed/Used Tools: Pump Flange Size: 21 Breast pump type: Double-Electric Breast Pump Reason for Pumping: supplementation Pumping frequency: q 3 hours  Interventions Interventions: DEBP;Education  Discharge Pump: Stork Pump [employer] given)  Consult Status Consult Status: Follow-up Date: 05/18/22 Follow-up type: In-patient    [name] [facility] 05/17/2022, 3:19 PM

## 2022-05-18 MED ORDER — LABETALOL HCL 200 MG PO TABS
400.0000 mg | ORAL_TABLET | Freq: Three times a day (TID) | ORAL | Status: DC
  Administered 2022-05-18 – 2022-05-19 (×3): 400 mg via ORAL
  Filled 2022-05-18 (×3): qty 2

## 2022-05-18 MED ORDER — LABETALOL HCL 200 MG PO TABS
200.0000 mg | ORAL_TABLET | Freq: Three times a day (TID) | ORAL | Status: DC
  Administered 2022-05-18: 200 mg via ORAL
  Filled 2022-05-18: qty 1

## 2022-05-18 MED ORDER — NIFEDIPINE ER OSMOTIC RELEASE 60 MG PO TB24
60.0000 mg | ORAL_TABLET | Freq: Two times a day (BID) | ORAL | Status: DC
  Administered 2022-05-18 – 2022-05-19 (×3): 60 mg via ORAL
  Filled 2022-05-18 (×3): qty 1

## 2022-05-18 NOTE — Progress Notes (Signed)
CSW received and acknowledges consult for EDPS of 8.  Consult screened out due to 8 on EDPS does not warrant a CSW consult.  MOB whom scores are greater than 9/yes to question 10 on Edinburgh Postpartum Depression Screen warrants a CSW consult.   Blaine Hamper, MSW, LCSW Clinical Social Work [phone]

## 2022-05-18 NOTE — Progress Notes (Signed)
Post Partum Day 4 Subjective: no complaints, up ad lib, voiding, tolerating PO, and + flatus.  No HA, vision change, RUQ pain, CP/SOB.  Objective: Blood pressure (!) 141/91, pulse 88, temperature 98.5 F (36.9 C), temperature source Oral, resp. rate 18, height  (1.676 m), weight 99.8 kg, last menstrual period 09/05/2021, SpO2 99 %, unknown if currently breastfeeding.  Physical Exam:  General: alert, cooperative, and appears stated age 27: appropriate Uterine Fundus: firm Incision: healing well, no significant drainage, no dehiscence DVT Evaluation: No evidence of DVT seen on physical exam. Negative Homan's sign. No cords or calf tenderness.  No results for input(s): "HGB", "HCT" in the last 72 hours.  Assessment/Plan: Plan for discharge tomorrow Pre-eclampsia with severe features-elevated BPs overnight.  Procardia 60 QD increased to BID.  Will closely monitor.  -s/p Mag recovery  -Asymptomatic   LOS: 6 days   Mitchel Honour, DO 05/18/2022, 10:57 AM

## 2022-05-18 NOTE — Plan of Care (Signed)
  Problem: Activity: Goal: Will verbalize the importance of balancing activity with adequate rest periods Outcome: Progressing Goal: Ability to tolerate increased activity will improve Outcome: Progressing   Problem: Coping: Goal: Ability to identify and utilize available resources and services will improve Outcome: Progressing   Problem: Life Cycle: Goal: Chance of risk for complications during the postpartum period will decrease Outcome: Progressing   Problem: Role Relationship: Goal: Ability to demonstrate positive interaction with newborn will improve Outcome: Progressing   Problem: Skin Integrity: Goal: Demonstration of wound healing without infection will improve Outcome: Progressing   

## 2022-05-19 MED ORDER — NIFEDIPINE ER 60 MG PO TB24: 60.0000 mg | ORAL_TABLET | Freq: Two times a day (BID) | ORAL | 0 refills | Status: AC

## 2022-05-19 MED ORDER — LABETALOL HCL 200 MG PO TABS: 400.0000 mg | ORAL_TABLET | Freq: Three times a day (TID) | ORAL | 0 refills | Status: AC

## 2022-05-19 NOTE — Progress Notes (Signed)
Post Partum Day 5 Subjective: no complaints, up ad lib, voiding, tolerating PO, and + flatus.  No HA, vision change, RUQ pain, CP/SOB.  Objective: Blood pressure 125/77, pulse 89, temperature 98.7 F (37.1 C), temperature source Oral, resp. rate 18, height  (1.676 m), weight 99.8 kg, last menstrual period 09/05/2021, SpO2 100 %, unknown if currently breastfeeding.  Physical Exam:  General: alert, cooperative, and appears stated age 27: appropriate Uterine Fundus: firm Incision: healing well, no significant drainage, no dehiscence DVT Evaluation: No evidence of DVT seen on physical exam. Negative Homan's sign. No cords or calf tenderness.  No results for input(s): "HGB", "HCT" in the last 72 hours.  Assessment/Plan: Discharge home and Breastfeeding Pre-eclampsia with severe features-BPs now well controlled on Procardia 60XL BID and labetalol 400 TID.  Patient continues to have no symptoms.  Will have patient RTO Monday for BP check.   LOS: 7 days   Mitchel Honour, DO 05/19/2022, 8:36 AM

## 2022-05-19 NOTE — Discharge Instructions (Signed)
Call MD for T>100.4, heavy vaginal bleeding, severe abdominal pain, intractable nausea and/or vomiting, or respiratory distress.  Also, call for headache, visual disturbance.

## 2022-05-19 NOTE — Discharge Summary (Signed)
Postpartum Discharge Summary  Patient Name: Stacy Black DOB: 15-Oct-1995 MRN: [id number]  Date of admission: 05/12/2022 Delivery date:05/14/2022  Delivering provider: Tawni Levy  Date of discharge: 05/19/2022  Admitting diagnosis: Pregnancy [Z34.90] Intrauterine pregnancy: [redacted]w[redacted]d     Secondary diagnosis:  Principal Problem:   Pregnancy  Additional problems: Pre-eclampsia with severe features, Pre-term delivery, A2DM   Discharge diagnosis: Preterm Pregnancy Delivered, Preeclampsia (severe), and GDM A2                                              Post partum procedures: postpartum magnesium sulfate Augmentation: AROM, Pitocin, and Cytotec Complications: None  Hospital course: Induction of Labor With Vaginal Delivery   27 y.o. yo G1P0101 at [redacted]w[redacted]d was admitted to the hospital 05/12/2022 for induction of labor.  Indication for induction: Preeclampsia.  Patient had an labor course complicated by n/a Membrane Rupture Time/Date: 7:45 AM ,05/14/2022   Delivery Method:Vaginal, Vacuum (Extractor)  Episiotomy: None  Lacerations:  2nd degree  Details of delivery can be found in separate delivery note.  Patient had a postpartum course complicated by n/a. Patient received magnesium sulfate x 24 hours postpartum.  Blood pressures required an extended postpartum stay in order to establish control.  Ultimately, on PPD5, patient's BPs were well controlled on Procardia 60XL BID and labetalol 400 TID.  Patient was asymptomatic.  Patient is discharged home 05/19/22.  Newborn Data: Birth date:05/14/2022  Birth time:7:26 PM  Gender:Female  Living status:Living  Apgars:7 ,8  Weight:2523 g   Magnesium Sulfate received: Yes: Seizure prophylaxis BMZ received: No Rhophylac:No MMR:No T-DaP:Given prenatally Flu: No Transfusion:No  Physical exam  Vitals:   05/18/22 1609 05/18/22 2014 05/19/22 0007 05/19/22 0351  BP: (!) 144/86 132/79 107/74 125/77  Pulse: 87 91 85 89  Resp:  Temp:  98.3  F (36.8 C) 98.3 F (36.8 C) 98.7 F (37.1 C)  TempSrc:  Oral Oral Oral  SpO2:  100% 100% 100%  Weight:      Height:       General: alert, cooperative, and no distress Lochia: appropriate Uterine Fundus: firm Incision: Healing well with no significant drainage, No significant erythema DVT Evaluation: No evidence of DVT seen on physical exam. Negative Homan's sign. No cords or calf tenderness. Labs: Lab Results  Component Value Date   WBC 19.3 (H) 05/15/2022   HGB 8.6 (L) 05/15/2022   HCT 26.1 (L) 05/15/2022   MCV 78.9 (L) 05/15/2022   PLT 207 05/15/2022      Latest Ref Rng & Units 05/14/2022   12:35 AM  CMP  Glucose 70 - 99 mg/dL 82   BUN 6 - 20 mg/dL 11   Creatinine 7.82 - 1.00 mg/dL 9.56   Sodium 213 - 086 mmol/L 134   Potassium 3.5 - 5.1 mmol/L 3.6   Chloride 98 - 111 mmol/L 102   CO2 22 - 32 mmol/L 21   Calcium 8.9 - 10.3 mg/dL 7.5   Total Protein 6.5 - 8.1 g/dL 5.6   Total Bilirubin 0.3 - 1.2 mg/dL 0.5   Alkaline Phos 38 - 126 U/L 98   AST 15 - 41 U/L 19   ALT 0 - 44 U/L 15    Edinburgh Score:    05/19/2022    5:46 AM  Edinburgh Postnatal Depression Scale Screening Tool  I have been able  to laugh and see the funny side of things. 0  I have looked forward with enjoyment to things. 0  I have blamed myself unnecessarily when things went wrong. 2  I have been anxious or worried for no good reason. 1  I have felt scared or panicky for no good reason. 1  Things have been getting on top of me. 1  I have been so unhappy that I have had difficulty sleeping. 1  I have felt sad or miserable. 1  I have been so unhappy that I have been crying. 1  The thought of harming myself has occurred to me. 0  Edinburgh Postnatal Depression Scale Total 8     After visit meds:  Allergies as of 05/19/2022   No Known Allergies      Medication List     STOP taking these medications    glyBURIDE 2.5 MG tablet Commonly known as: DIABETA   metformin 1000 MG (OSM) 24 hr  tablet Commonly known as: FORTAMET   metoCLOPramide 10 MG tablet Commonly known as: Reglan   ondansetron 4 MG tablet Commonly known as: ZOFRAN       TAKE these medications    acetaminophen 325 MG tablet Commonly known as: Tylenol Take 2 tablets (650 mg total) by mouth every 4 (four) hours as needed (for pain scale < 4).   docusate sodium 100 MG capsule Commonly known as: COLACE Take 1 capsule (100 mg total) by mouth daily.   famotidine 20 MG tablet Commonly known as: PEPCID Take 1 tablet (20 mg total) by mouth at bedtime.   ferrous sulfate 325 (65 FE) MG tablet Take 1 tablet (325 mg total) by mouth every other day.   ibuprofen 600 MG tablet Commonly known as: ADVIL Take 1 tablet (600 mg total) by mouth every 6 (six) hours.   labetalol 200 MG tablet Commonly known as: NORMODYNE Take 2 tablets (400 mg total) by mouth 3 (three) times daily.   NIFEdipine 30 MG 24 hr tablet Commonly known as: ADALAT CC Take 1 tablet (30 mg total) by mouth at bedtime.   NIFEdipine 60 MG 24 hr tablet Commonly known as: ADALAT CC Take 1 tablet (60 mg total) by mouth every 12 (twelve) hours.       Nifedipine 30XL QD was sent on 4/17 prior to d/c.  Patient informed that this is not her appropriate dose and not to take this  Discharge home in stable condition Infant Feeding: Breast Infant Disposition:home with mother Discharge instruction: per After Visit Summary and Postpartum booklet. Activity: Advance as tolerated. Pelvic rest for 6 weeks.  Diet: routine diet Future Appointments:No future appointments. Follow up Visit: Monday at [facility] for BP check  Follow-up Information     [facility] Follow up.   Why: Please follow up for a 1 week blood pressure check and a 6 week postpartum visit. Contact information: [address] [place] [address] [phone]                  05/19/2022 Mitchel Honour, DO

## 2022-05-26 ENCOUNTER — Telehealth (HOSPITAL_COMMUNITY): Payer: Self-pay

## 2022-05-26 NOTE — Telephone Encounter (Signed)
Patient reports feeling good. Patient states that she has an appointment with her doctor next week. Patient declines questions/concerns about her health and healing.  Patient reports that baby is doing well and had a check up with her pediatrician this week. Baby sleeps in a bassinet. RN reviewed ABC's of safe sleep with patient. Patient declines any questions or concerns about baby.  EPDS score is 9.  Suann Larry [facility]  Perinatal Services   05/26/22,1627

## 2022-05-28 ENCOUNTER — Other Ambulatory Visit: Payer: Self-pay | Admitting: Certified Nurse Midwife

## 2022-06-26 DIAGNOSIS — Z8632 Personal history of gestational diabetes: Secondary | ICD-10-CM | POA: Diagnosis not present

## 2022-06-26 DIAGNOSIS — Z1389 Encounter for screening for other disorder: Secondary | ICD-10-CM | POA: Diagnosis not present

## 2022-06-29 ENCOUNTER — Encounter: Payer: Self-pay | Admitting: *Deleted

## 2022-07-27 ENCOUNTER — Emergency Department (HOSPITAL_BASED_OUTPATIENT_CLINIC_OR_DEPARTMENT_OTHER)
Admission: EM | Admit: 2022-07-27 | Discharge: 2022-07-27 | Disposition: A | Discharge status: Home / Self Care | Payer: BC Managed Care – PPO | Attending: Emergency Medicine | Admitting: Emergency Medicine

## 2022-07-27 ENCOUNTER — Encounter: Payer: Self-pay | Admitting: Obstetrics and Gynecology

## 2022-07-27 ENCOUNTER — Emergency Department (HOSPITAL_BASED_OUTPATIENT_CLINIC_OR_DEPARTMENT_OTHER): Payer: BC Managed Care – PPO

## 2022-07-27 ENCOUNTER — Other Ambulatory Visit: Payer: Self-pay

## 2022-07-27 ENCOUNTER — Encounter (HOSPITAL_BASED_OUTPATIENT_CLINIC_OR_DEPARTMENT_OTHER): Payer: Self-pay

## 2022-07-27 DIAGNOSIS — S62346A Nondisplaced fracture of base of fifth metacarpal bone, right hand, initial encounter for closed fracture: Secondary | ICD-10-CM | POA: Insufficient documentation

## 2022-07-27 DIAGNOSIS — W228XXA Striking against or struck by other objects, initial encounter: Secondary | ICD-10-CM | POA: Diagnosis not present

## 2022-07-27 DIAGNOSIS — S62316A Displaced fracture of base of fifth metacarpal bone, right hand, initial encounter for closed fracture: Secondary | ICD-10-CM | POA: Diagnosis not present

## 2022-07-27 DIAGNOSIS — M79641 Pain in right hand: Secondary | ICD-10-CM | POA: Diagnosis not present

## 2022-07-27 MED ORDER — OXYCODONE HCL 5 MG PO TABS
5.0000 mg | ORAL_TABLET | Freq: Once | ORAL | Status: AC
  Administered 2022-07-27: 5 mg via ORAL
  Filled 2022-07-27: qty 1

## 2022-07-27 NOTE — Discharge Instructions (Addendum)
You were seen in the ER today after hand injury.  As we discussed you broke the base of your right fifth finger.  This is called your proximal fifth metacarpal.  We have placed you in a splint, and you need to follow-up with the orthopedist.  I have attached their contact information.  Please give their office a call first thing in the morning to request an appointment.  In the meantime I recommend using some ice as needed for swelling.  You can take ibuprofen and/or Tylenol as needed for pain. We gave you one tablet of oxycodone for pain. This can pass into breast milk so if you are nursing I recommend waiting to return to breastfeeding for the next 24 hours if possible.  Continue to monitor how you're doing and return to the ER for new or worsening symptoms.  Continue to monitor how you're doing and return to the ER for new or worsening symptoms.

## 2022-07-27 NOTE — ED Provider Notes (Signed)
[place] EMERGENCY DEPARTMENT AT [facility] Provider Note   CSN: [id number] Arrival date & time: 07/27/22  2001     History  Chief Complaint  Patient presents with   Hand Injury    Stacy Black is a 27 y.o. female who presents the emergency department after right hand injury.  Patient states that she hit the right side of her hand on a door hinge about an hour prior to ER arrival.  She had significant pain and swelling since then.   Hand Injury      Home Medications Prior to Admission medications   Medication Sig Start Date End Date Taking? Authorizing Provider  acetaminophen (TYLENOL) 325 MG tablet Take 2 tablets (650 mg total) by mouth every 4 (four) hours as needed (for pain scale < 4). 05/16/22   Lyn Henri, MD  docusate sodium (COLACE) 100 MG capsule Take 1 capsule (100 mg total) by mouth daily. 05/16/22   Lyn Henri, MD  famotidine (PEPCID) 20 MG tablet Take 1 tablet (20 mg total) by mouth at bedtime. 10/28/21   Donette Larry, CNM  ferrous sulfate 325 (65 FE) MG tablet Take 1 tablet (325 mg total) by mouth every other day. 05/16/22   Lyn Henri, MD  ibuprofen (ADVIL) 600 MG tablet Take 1 tablet (600 mg total) by mouth every 6 (six) hours. 05/16/22   Lyn Henri, MD  labetalol (NORMODYNE) 200 MG tablet Take 2 tablets (400 mg total) by mouth 3 (three) times daily. 05/19/22   Morris, Aundra Millet, DO  NIFEdipine (ADALAT CC) 30 MG 24 hr tablet Take 1 tablet (30 mg total) by mouth at bedtime. 05/16/22   Lyn Henri, MD  NIFEdipine (ADALAT CC) 60 MG 24 hr tablet Take 1 tablet (60 mg total) by mouth every 12 (twelve) hours. 05/19/22   Mitchel Honour, DO      Allergies    Patient has no known allergies.    Review of Systems   Review of Systems  Musculoskeletal:  Positive for arthralgias.  All other systems reviewed and are negative.   Physical Exam Updated Vital Signs BP 127/86   Pulse 71   Temp 97.8 F (36.6 C) (Oral)   Resp 17    Ht 5\' 6"  (1.676 m)   Wt 79 kg   LMP 07/22/2022   SpO2 99%   Breastfeeding Yes   BMI 28.11 kg/m  Physical Exam Vitals and nursing note reviewed.  Constitutional:      Appearance: Normal appearance.  HENT:     Head: Normocephalic and atraumatic.  Eyes:     Conjunctiva/sclera: Conjunctivae normal.  Pulmonary:     Effort: Pulmonary effort is normal. No respiratory distress.  Musculoskeletal:     Right hand: Deformity and tenderness present.     Left hand: Normal.     Comments: Significant swelling noted to the base of the right fifth metacarpal with point tenderness.  Normal range of motion of the digits, with some pain with ranging the fifth digit.  Skin:    General: Skin is warm and dry.     Capillary Refill: Capillary refill takes less than 2 seconds.     Comments: Superficial laceration noted to the lateral right hand  Neurological:     Mental Status: She is alert.  Psychiatric:        Mood and Affect: Mood normal.        Behavior: Behavior normal.     ED Results / Procedures / Treatments  Labs (all labs ordered are listed, but only abnormal results are displayed) Labs Reviewed - No data to display  EKG None  Radiology DG Hand Complete Right  Result Date: 07/27/2022 CLINICAL DATA:  Injury. Hit hand on door injury approximately 1 hour ago. Right hand swelling. EXAM: RIGHT HAND - COMPLETE 3+ VIEW COMPARISON:  None Available. FINDINGS: Normal bone mineralization.  Joint spaces are preserved. There is oblique linear lucency extending from the medial cortex of the proximal metadiaphysis of the fifth metacarpal proximally and laterally to the far proximal aspect of the lateral base of the fifth metacarpal without definitely extending to the articular surface. There is up to approximately 2 mm fracture line diastasis and 2 mm lateral displacement of the distal fracture component resection of the proximal fracture component. Within limitations overlapping bones on lateral view,  no definite AP dimension displacement is seen. IMPRESSION: Acute mildly displaced fracture of the proximal fifth metacarpal. No definite intra-articular extension is seen. Electronically Signed   By: Neita Garnet M.D.   On: 07/27/2022 21:00    Procedures .Ortho Injury Treatment  Date/Time: 07/27/2022 9:33 PM  Performed by: Su Monks, PA-C Authorized by: Su Monks, PA-C   Consent:    Consent obtained:  Verbal   Consent given by:  Patient   Risks discussed:  Fracture   Alternatives discussed:  No treatmentInjury location: hand Location details: right hand Injury type: fracture Fracture type: fifth metacarpal Pre-procedure neurovascular assessment: neurovascularly intact Pre-procedure distal perfusion: normal Pre-procedure neurological function: normal Pre-procedure range of motion: normal  Anesthesia: Local anesthesia used: no  Patient sedated: NoManipulation performed: no Immobilization: splint Splint type: ulnar gutter Splint Applied by: ED Tech Supplies used: Ortho-Glass Post-procedure neurovascular assessment: post-procedure neurovascularly intact Post-procedure distal perfusion: normal Post-procedure neurological function: normal Post-procedure range of motion: normal       Medications Ordered in ED Medications  oxyCODONE (Oxy IR/ROXICODONE) immediate release tablet 5 mg (5 mg Oral Given 07/27/22 2130)    ED Course/ Medical Decision Making/ A&P                             Medical Decision Making Amount and/or Complexity of Data Reviewed Radiology: ordered.  Risk Prescription drug management.   This patient is a 27 y.o. female  who presents to the ED for concern of right hand injury.   Past Medical History / Co-morbidities / Social History: No significant PMH  Additional history: Chart reviewed. Pertinent results include: G1P1, 2 months postpartum, currently breastfeeding  Physical Exam: Physical exam performed. The pertinent  findings include: No distress. Swelling and deformity of the lateral right hand over base of 5th metacarpal. Normal ROM of digits with some pain, normal capillary refill, normal sensation.   Lab Tests/Imaging studies: I personally interpreted labs/imaging and the pertinent results include:  XR of right hand with mildly displaced fracture of proximal fifth metacarpal. I agree with the radiologist interpretation.  Medications: I ordered medication including roxicodone.  I have reviewed the patients home medicines and have made adjustments as needed.   Disposition: After consideration of the diagnostic results and the patients response to treatment, I feel that emergency department workup does not suggest an emergent condition requiring admission or immediate intervention beyond what has been performed at this time. The plan is: discharge to home with ulnar gutter splint and orthopedic/hand follow up. Patient declined opioids at home, will take OTC meds. Advised to avoid breastfeeding for at least  24 hours after taking pain medication. The patient is safe for discharge and has been instructed to return immediately for worsening symptoms, change in symptoms or any other concerns.  Final Clinical Impression(s) / ED Diagnoses Final diagnoses:  Nondisplaced fracture of base of fifth metacarpal bone, right hand, initial encounter for closed fracture    Rx / DC Orders ED Discharge Orders     None      Portions of this report may have been transcribed using voice recognition software. Every effort was made to ensure accuracy; however, inadvertent computerized transcription errors may be present.    Jeanella Flattery 07/27/22 2134    Maia Plan, MD 07/31/22 [phone]

## 2022-07-27 NOTE — ED Triage Notes (Signed)
POV from home, A&O x 4, GCS 15, amb to room  Sts she hit hand on door hinge approx one hour ago, right hand swelling since then.

## 2022-07-31 DIAGNOSIS — S92354D Nondisplaced fracture of fifth metatarsal bone, right foot, subsequent encounter for fracture with routine healing: Secondary | ICD-10-CM | POA: Diagnosis not present

## 2022-07-31 DIAGNOSIS — S62326A Displaced fracture of shaft of fifth metacarpal bone, right hand, initial encounter for closed fracture: Secondary | ICD-10-CM | POA: Diagnosis not present

## 2022-08-09 DIAGNOSIS — S92354D Nondisplaced fracture of fifth metatarsal bone, right foot, subsequent encounter for fracture with routine healing: Secondary | ICD-10-CM | POA: Diagnosis not present

## 2022-08-14 DIAGNOSIS — M79641 Pain in right hand: Secondary | ICD-10-CM | POA: Diagnosis not present

## 2022-08-14 DIAGNOSIS — S92354D Nondisplaced fracture of fifth metatarsal bone, right foot, subsequent encounter for fracture with routine healing: Secondary | ICD-10-CM | POA: Diagnosis not present

## 2022-08-28 DIAGNOSIS — S62326A Displaced fracture of shaft of fifth metacarpal bone, right hand, initial encounter for closed fracture: Secondary | ICD-10-CM | POA: Diagnosis not present

## 2022-10-02 ENCOUNTER — Emergency Department (HOSPITAL_BASED_OUTPATIENT_CLINIC_OR_DEPARTMENT_OTHER)
Admission: EM | Admit: 2022-10-02 | Discharge: 2022-10-03 | Disposition: A | Discharge status: Home / Self Care | Payer: BC Managed Care – PPO | Attending: Emergency Medicine | Admitting: Emergency Medicine

## 2022-10-02 ENCOUNTER — Other Ambulatory Visit: Payer: Self-pay

## 2022-10-02 ENCOUNTER — Encounter (HOSPITAL_BASED_OUTPATIENT_CLINIC_OR_DEPARTMENT_OTHER): Payer: Self-pay

## 2022-10-02 DIAGNOSIS — S62326A Displaced fracture of shaft of fifth metacarpal bone, right hand, initial encounter for closed fracture: Secondary | ICD-10-CM | POA: Diagnosis not present

## 2022-10-02 DIAGNOSIS — R1031 Right lower quadrant pain: Secondary | ICD-10-CM | POA: Insufficient documentation

## 2022-10-02 DIAGNOSIS — R7309 Other abnormal glucose: Secondary | ICD-10-CM | POA: Insufficient documentation

## 2022-10-02 DIAGNOSIS — E871 Hypo-osmolality and hyponatremia: Secondary | ICD-10-CM | POA: Insufficient documentation

## 2022-10-02 DIAGNOSIS — I10 Essential (primary) hypertension: Secondary | ICD-10-CM | POA: Diagnosis not present

## 2022-10-02 DIAGNOSIS — R197 Diarrhea, unspecified: Secondary | ICD-10-CM | POA: Insufficient documentation

## 2022-10-02 DIAGNOSIS — M79641 Pain in right hand: Secondary | ICD-10-CM | POA: Diagnosis not present

## 2022-10-02 LAB — URINALYSIS, ROUTINE W REFLEX MICROSCOPIC
Bacteria, UA: NONE SEEN
Bilirubin Urine: NEGATIVE
Glucose, UA: NEGATIVE mg/dL
Hgb urine dipstick: NEGATIVE
Ketones, ur: NEGATIVE mg/dL
Nitrite: NEGATIVE
Specific Gravity, Urine: 1.025 (ref 1.005–1.030)
pH: 5.5 (ref 5.0–8.0)

## 2022-10-02 LAB — COMPREHENSIVE METABOLIC PANEL
ALT: 24 U/L (ref 0–44)
AST: 15 U/L (ref 15–41)
Albumin: 4.1 g/dL (ref 3.5–5.0)
Alkaline Phosphatase: 82 U/L (ref 38–126)
Anion gap: 10 (ref 5–15)
BUN: 10 mg/dL (ref 6–20)
CO2: 20 mmol/L — ABNORMAL LOW (ref 22–32)
Calcium: 8.6 mg/dL — ABNORMAL LOW (ref 8.9–10.3)
Chloride: 104 mmol/L (ref 98–111)
Creatinine, Ser: 0.63 mg/dL (ref 0.44–1.00)
GFR, Estimated: >60 mL/min (ref >60)
Glucose, Bld: 114 mg/dL — ABNORMAL HIGH (ref 70–99)
Potassium: 3.7 mmol/L (ref 3.5–5.1)
Sodium: 134 mmol/L — ABNORMAL LOW (ref 135–145)
Total Bilirubin: 0.7 mg/dL (ref 0.3–1.2)
Total Protein: 7.4 g/dL (ref 6.5–8.1)

## 2022-10-02 LAB — CBC
HCT: 36.3 % (ref 36.0–46.0)
Hemoglobin: 12.3 g/dL (ref 12.0–15.0)
MCH: 27.5 pg (ref 26.0–34.0)
MCHC: 33.9 g/dL (ref 30.0–36.0)
MCV: 81 fL (ref 80.0–100.0)
Platelets: 301 10*3/uL (ref 150–400)
RBC: 4.48 MIL/uL (ref 3.87–5.11)
RDW: 14.2 % (ref 11.5–15.5)
WBC: 10.3 10*3/uL (ref 4.0–10.5)
nRBC: 0 % (ref 0.0–0.2)

## 2022-10-02 LAB — LIPASE, BLOOD: Lipase: 24 U/L (ref 11–51)

## 2022-10-02 LAB — PREGNANCY, URINE: Preg Test, Ur: NEGATIVE

## 2022-10-02 NOTE — ED Triage Notes (Signed)
Pt arrived POV for RLQ pain that radiates into there right flank, pt denies urinary symptoms denies n/v, but reports some diarrhea. 4 months post-partum, denies vaginal bleeding or discharge.VSS, NAD noted, A&O x4.

## 2022-10-02 NOTE — ED Provider Notes (Signed)
[place] EMERGENCY DEPARTMENT AT [facility] Provider Note   CSN: [id number] Arrival date & time: 10/02/22  2028     History  Chief Complaint  Patient presents with   Abdominal Pain    Stacy Black is a 27 y.o. female.  The history is provided by the patient.  Abdominal Pain She has history of hypertension, is 4-1/2 months postpartum (delivery on 05/14/2022) and comes in complaining of right lower quadrant and right flank pain which started yesterday and got worse today although it is now starting to ease off.  She also has diarrhea today stating that she had 8 bowel movements although none for the last 3 hours.  She denies nausea or vomiting and denies fever or chills.  She denies any urinary urgency, frequency, tenesmus, dysuria.   Home Medications Prior to Admission medications   Medication Sig Start Date End Date Taking? Authorizing Provider  acetaminophen (TYLENOL) 325 MG tablet Take 2 tablets (650 mg total) by mouth every 4 (four) hours as needed (for pain scale < 4). 05/16/22   Lyn Henri, MD  docusate sodium (COLACE) 100 MG capsule Take 1 capsule (100 mg total) by mouth daily. 05/16/22   Lyn Henri, MD  famotidine (PEPCID) 20 MG tablet Take 1 tablet (20 mg total) by mouth at bedtime. 10/28/21   Donette Larry, CNM  ferrous sulfate 325 (65 FE) MG tablet Take 1 tablet (325 mg total) by mouth every other day. 05/16/22   Lyn Henri, MD  ibuprofen (ADVIL) 600 MG tablet Take 1 tablet (600 mg total) by mouth every 6 (six) hours. 05/16/22   Lyn Henri, MD  labetalol (NORMODYNE) 200 MG tablet Take 2 tablets (400 mg total) by mouth 3 (three) times daily. 05/19/22   Morris, Aundra Millet, DO  NIFEdipine (ADALAT CC) 30 MG 24 hr tablet Take 1 tablet (30 mg total) by mouth at bedtime. 05/16/22   Lyn Henri, MD  NIFEdipine (ADALAT CC) 60 MG 24 hr tablet Take 1 tablet (60 mg total) by mouth every 12 (twelve) hours. 05/19/22   Mitchel Honour, DO       Allergies    Patient has no known allergies.    Review of Systems   Review of Systems  Gastrointestinal:  Positive for abdominal pain.  All other systems reviewed and are negative.   Physical Exam Updated Vital Signs BP 128/89 (BP Location: Right Arm)   Pulse 99   Temp 98.3 F (36.8 C) (Oral)   Resp 19   Ht 5\' 6"  (1.676 m)   Wt 80 kg   LMP 09/20/2022 (Exact Date)   SpO2 99%   BMI 28.47 kg/m  Physical Exam Vitals and nursing note reviewed.   27 year old female, resting comfortably and in no acute distress. Vital signs are normal. Oxygen saturation is 99%, which is normal. Head is normocephalic and atraumatic. PERRLA, EOMI.  Back is nontender and there is no CVA tenderness. Lungs are clear without rales, wheezes, or rhonchi. Chest is nontender. Heart has regular rate and rhythm without murmur. Abdomen is soft, flat, with mild right lower quadrant pain which is clearly centered over McBurney's area.  There is no rebound or guarding. Extremities have no cyanosis or edema, full range of motion is present. Skin is warm and dry without rash. Neurologic: Mental status is normal, cranial nerves are intact, moves all extremities equally.  ED Results / Procedures / Treatments   Labs (all labs ordered are listed, but only abnormal results  are displayed) Labs Reviewed  COMPREHENSIVE METABOLIC PANEL - Abnormal; Notable for the following components:      Result Value   Sodium 134 (*)    CO2 20 (*)    Glucose, Bld 114 (*)    Calcium 8.6 (*)    All other components within normal limits  URINALYSIS, ROUTINE W REFLEX MICROSCOPIC - Abnormal; Notable for the following components:   Protein, ur TRACE (*)    Leukocytes,Ua TRACE (*)    All other components within normal limits  LIPASE, BLOOD  CBC  PREGNANCY, URINE   Radiology CT ABDOMEN PELVIS W CONTRAST  Result Date: 10/03/2022 CLINICAL DATA:  Right lower quadrant abdominal pain radiating into the right flank. Four months  postpartum. EXAM: CT ABDOMEN AND PELVIS WITH CONTRAST TECHNIQUE: Multidetector CT imaging of the abdomen and pelvis was performed using the standard protocol following bolus administration of intravenous contrast. RADIATION DOSE REDUCTION: This exam was performed according to the departmental dose-optimization program which includes automated exposure control, adjustment of the mA and/or kV according to patient size and/or use of iterative reconstruction technique. CONTRAST:  80mL OMNIPAQUE IOHEXOL 350 MG/ML SOLN COMPARISON:  None Available. FINDINGS: Lower chest: No acute abnormality. Hepatobiliary: Hepatic steatosis. Normal gallbladder. No biliary dilation. Pancreas: Unremarkable. Spleen: Unremarkable. Adrenals/Urinary Tract: Normal adrenal glands. No urinary calculi or hydronephrosis. Bladder is unremarkable. Stomach/Bowel: Normal caliber large and small bowel. No bowel wall thickening. The appendix is normal.Stomach is within normal limits. Vascular/Lymphatic: No significant vascular findings are present. No enlarged abdominal or pelvic lymph nodes. Reproductive: Unremarkable. Other: No free intraperitoneal fluid or air. Musculoskeletal: No acute fracture. IMPRESSION: 1. No acute abnormality in the abdomen or pelvis.  Normal appendix. 2. Hepatic steatosis. Electronically Signed   By: Minerva Fester M.D.   On: 10/03/2022 00:57    Procedures Procedures    Medications Ordered in ED Medications  iohexol (OMNIPAQUE) 350 MG/ML injection 100 mL (80 mLs Intravenous Contrast Given 10/03/22 0041)    ED Course/ Medical Decision Making/ A&P                                 Medical Decision Making Amount and/or Complexity of Data Reviewed Labs: ordered. Radiology: ordered.  Risk Prescription drug management.   Right lower quadrant pain.  Differential diagnosis is broad and includes, but is not limited to, appendicitis, mesenteric adenitis, diverticulitis, pyelonephritis, ovarian cyst, urolithiasis.   This is a differential which includes conditions with significant risk for morbidity and complications.  I have reviewed her laboratory test, and my interpretation is mild hyponatremia which is not felt to be clinically significant, mildly elevated random glucose which will need to be followed as an outpatient, normal transaminases and bilirubin, normal CBC, negative pregnancy test, normal urinalysis.  With diarrhea, I feel that mesenteric adenitis is the most likely diagnosis.  I have ordered CT of abdomen and pelvis to rule out appendicitis.  CT scan shows no acute process.  I have independently viewed the images, and agree with radiologist's interpretation.  I still feel this is most likely mesenteric adenitis.  I am discharging patient with instructions to take loperamide as needed for diarrhea, use over-the-counter NSAIDs and acetaminophen as needed for pain.  Return if symptoms worsen.  Final Clinical Impression(s) / ED Diagnoses Final diagnoses:  RLQ abdominal pain  Hyponatremia  Elevated random blood glucose level  Diarrhea, unspecified type    Rx / DC Orders ED Discharge Orders  None         Dione Booze, MD 10/03/22 [phone]

## 2022-10-03 ENCOUNTER — Emergency Department (HOSPITAL_BASED_OUTPATIENT_CLINIC_OR_DEPARTMENT_OTHER): Payer: BC Managed Care – PPO

## 2022-10-03 DIAGNOSIS — R109 Unspecified abdominal pain: Secondary | ICD-10-CM | POA: Diagnosis not present

## 2022-10-03 DIAGNOSIS — Z1159 Encounter for screening for other viral diseases: Secondary | ICD-10-CM | POA: Diagnosis not present

## 2022-10-03 DIAGNOSIS — J029 Acute pharyngitis, unspecified: Secondary | ICD-10-CM | POA: Diagnosis not present

## 2022-10-03 DIAGNOSIS — K760 Fatty (change of) liver, not elsewhere classified: Secondary | ICD-10-CM | POA: Diagnosis not present

## 2022-10-03 DIAGNOSIS — R197 Diarrhea, unspecified: Secondary | ICD-10-CM | POA: Diagnosis not present

## 2022-10-03 DIAGNOSIS — R1031 Right lower quadrant pain: Secondary | ICD-10-CM | POA: Diagnosis not present

## 2022-10-03 MED ORDER — IOHEXOL 350 MG/ML SOLN
100.0000 mL | Freq: Once | INTRAVENOUS | Status: AC | PRN
  Administered 2022-10-03: 80 mL via INTRAVENOUS

## 2022-10-03 NOTE — Discharge Instructions (Addendum)
You did take loperamide (Imodium A-D) as needed for diarrhea.  You may take ibuprofen and/or acetaminophen as needed for pain.  Your blood tests and CT scan did not show any sign of anything serious, I believe that your pain is related to the same virus that was causing your diarrhea.  However, if pain is getting worse, do not hesitate to return to the emergency department for further evaluation.

## 2022-10-15 DIAGNOSIS — E559 Vitamin D deficiency, unspecified: Secondary | ICD-10-CM | POA: Diagnosis not present

## 2022-10-15 DIAGNOSIS — R7301 Impaired fasting glucose: Secondary | ICD-10-CM | POA: Diagnosis not present

## 2022-10-15 DIAGNOSIS — K760 Fatty (change of) liver, not elsewhere classified: Secondary | ICD-10-CM | POA: Diagnosis not present

## 2022-10-15 DIAGNOSIS — E785 Hyperlipidemia, unspecified: Secondary | ICD-10-CM | POA: Diagnosis not present

## 2022-10-17 DIAGNOSIS — R109 Unspecified abdominal pain: Secondary | ICD-10-CM | POA: Diagnosis not present

## 2022-10-17 DIAGNOSIS — E785 Hyperlipidemia, unspecified: Secondary | ICD-10-CM | POA: Diagnosis not present

## 2022-10-17 DIAGNOSIS — K760 Fatty (change of) liver, not elsewhere classified: Secondary | ICD-10-CM | POA: Diagnosis not present

## 2022-10-18 DIAGNOSIS — F331 Major depressive disorder, recurrent, moderate: Secondary | ICD-10-CM | POA: Diagnosis not present

## 2022-10-18 DIAGNOSIS — F411 Generalized anxiety disorder: Secondary | ICD-10-CM | POA: Diagnosis not present

## 2022-11-08 DIAGNOSIS — F331 Major depressive disorder, recurrent, moderate: Secondary | ICD-10-CM | POA: Diagnosis not present

## 2022-11-08 DIAGNOSIS — F411 Generalized anxiety disorder: Secondary | ICD-10-CM | POA: Diagnosis not present

## 2022-11-14 DIAGNOSIS — F331 Major depressive disorder, recurrent, moderate: Secondary | ICD-10-CM | POA: Diagnosis not present

## 2022-11-14 DIAGNOSIS — F411 Generalized anxiety disorder: Secondary | ICD-10-CM | POA: Diagnosis not present

## 2022-12-06 DIAGNOSIS — F331 Major depressive disorder, recurrent, moderate: Secondary | ICD-10-CM | POA: Diagnosis not present

## 2022-12-06 DIAGNOSIS — F411 Generalized anxiety disorder: Secondary | ICD-10-CM | POA: Diagnosis not present

## 2022-12-13 DIAGNOSIS — F331 Major depressive disorder, recurrent, moderate: Secondary | ICD-10-CM | POA: Diagnosis not present

## 2022-12-13 DIAGNOSIS — F411 Generalized anxiety disorder: Secondary | ICD-10-CM | POA: Diagnosis not present

## 2023-01-11 DIAGNOSIS — R739 Hyperglycemia, unspecified: Secondary | ICD-10-CM | POA: Diagnosis not present

## 2023-01-11 DIAGNOSIS — R5383 Other fatigue: Secondary | ICD-10-CM | POA: Diagnosis not present

## 2023-01-11 DIAGNOSIS — E559 Vitamin D deficiency, unspecified: Secondary | ICD-10-CM | POA: Diagnosis not present

## 2023-01-11 DIAGNOSIS — Z13220 Encounter for screening for lipoid disorders: Secondary | ICD-10-CM | POA: Diagnosis not present

## 2023-01-15 DIAGNOSIS — Z23 Encounter for immunization: Secondary | ICD-10-CM | POA: Diagnosis not present

## 2023-01-15 DIAGNOSIS — Z0000 Encounter for general adult medical examination without abnormal findings: Secondary | ICD-10-CM | POA: Diagnosis not present

## 2023-01-15 IMAGING — DX DG ANKLE COMPLETE 3+V*R*
1 series · 3 of 3 positions shown · non-contrast
Comparison: None.

CLINICAL DATA: 25-year-old female status post fall twisting ankle
last night.

EXAM:
RIGHT ANKLE - COMPLETE 3+ VIEW

[Series 1: ankle · 0.14mm/px · 3 of 3 slices shown]
[im 1/3]
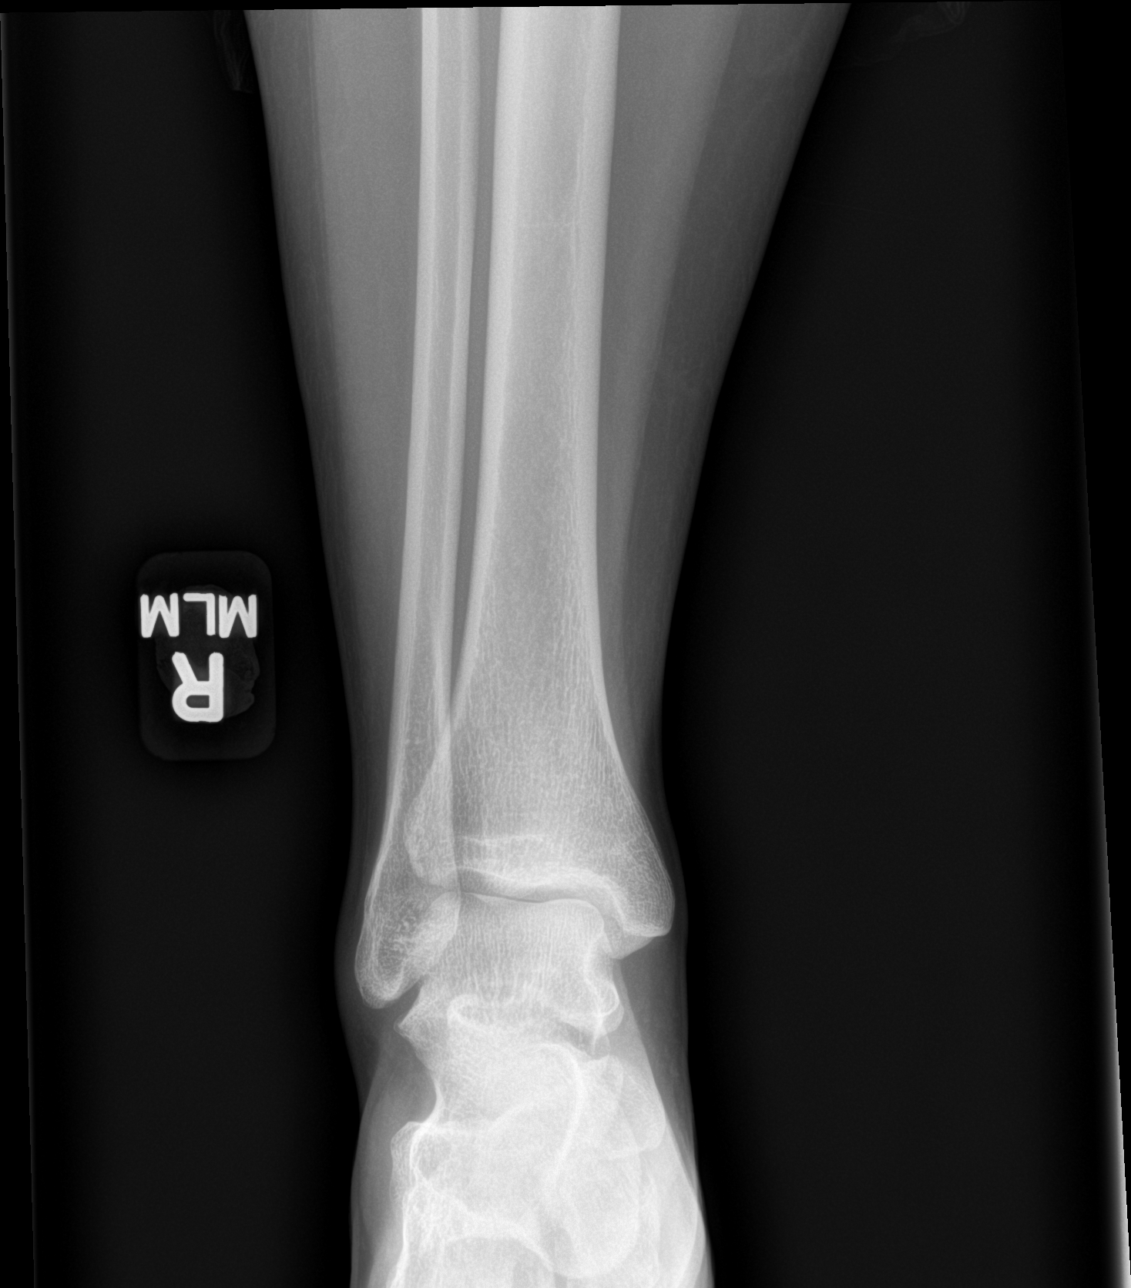
[im 2/3]
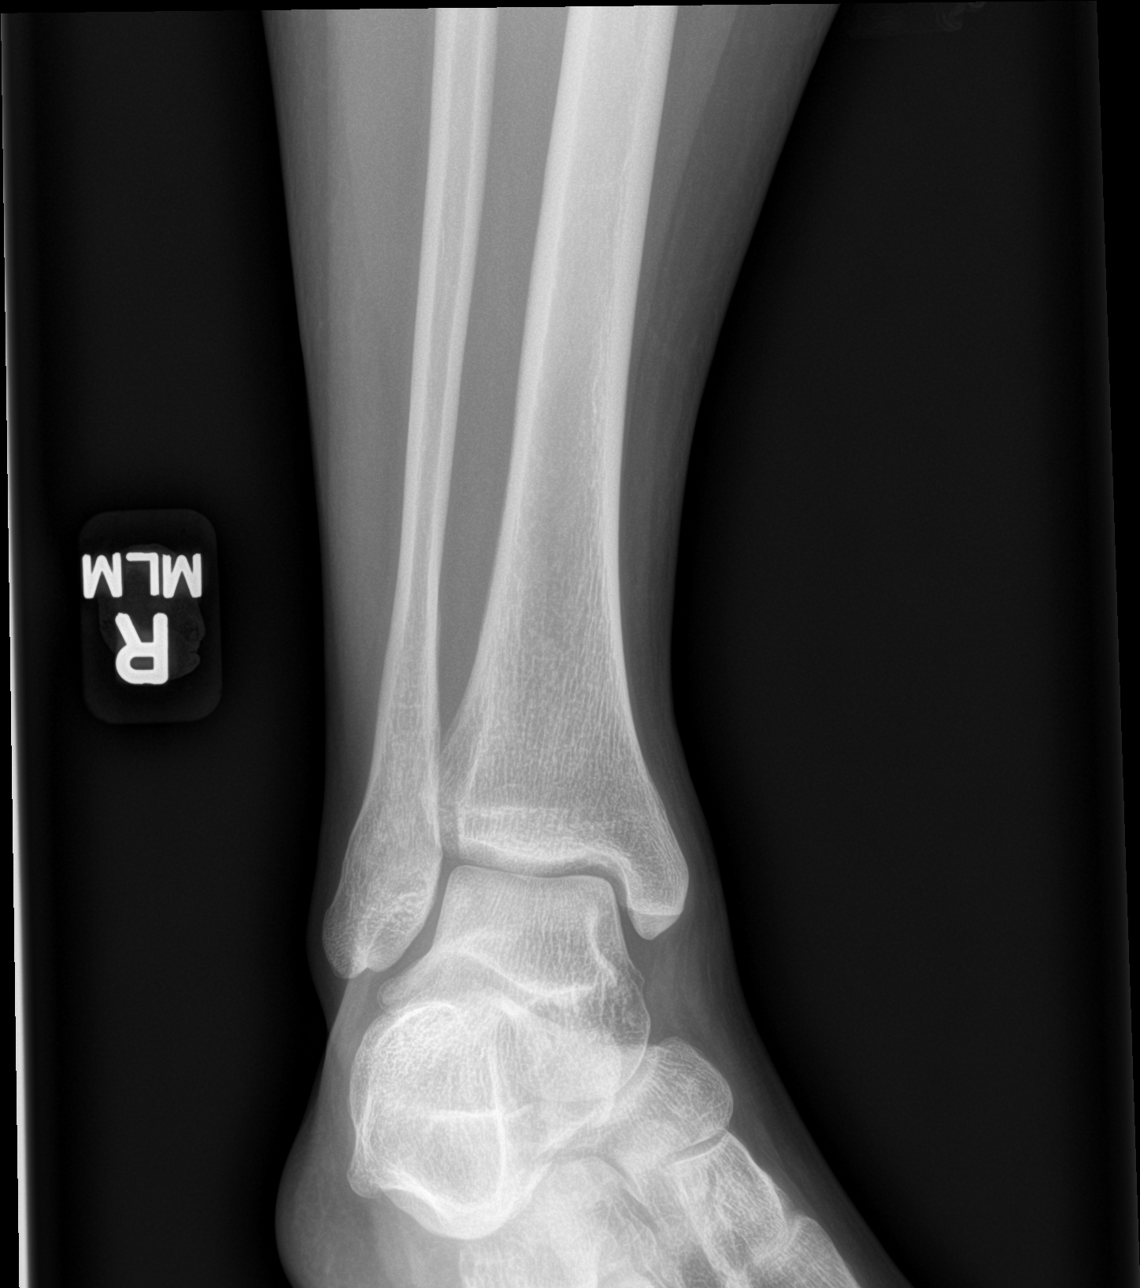
[im 3/3]
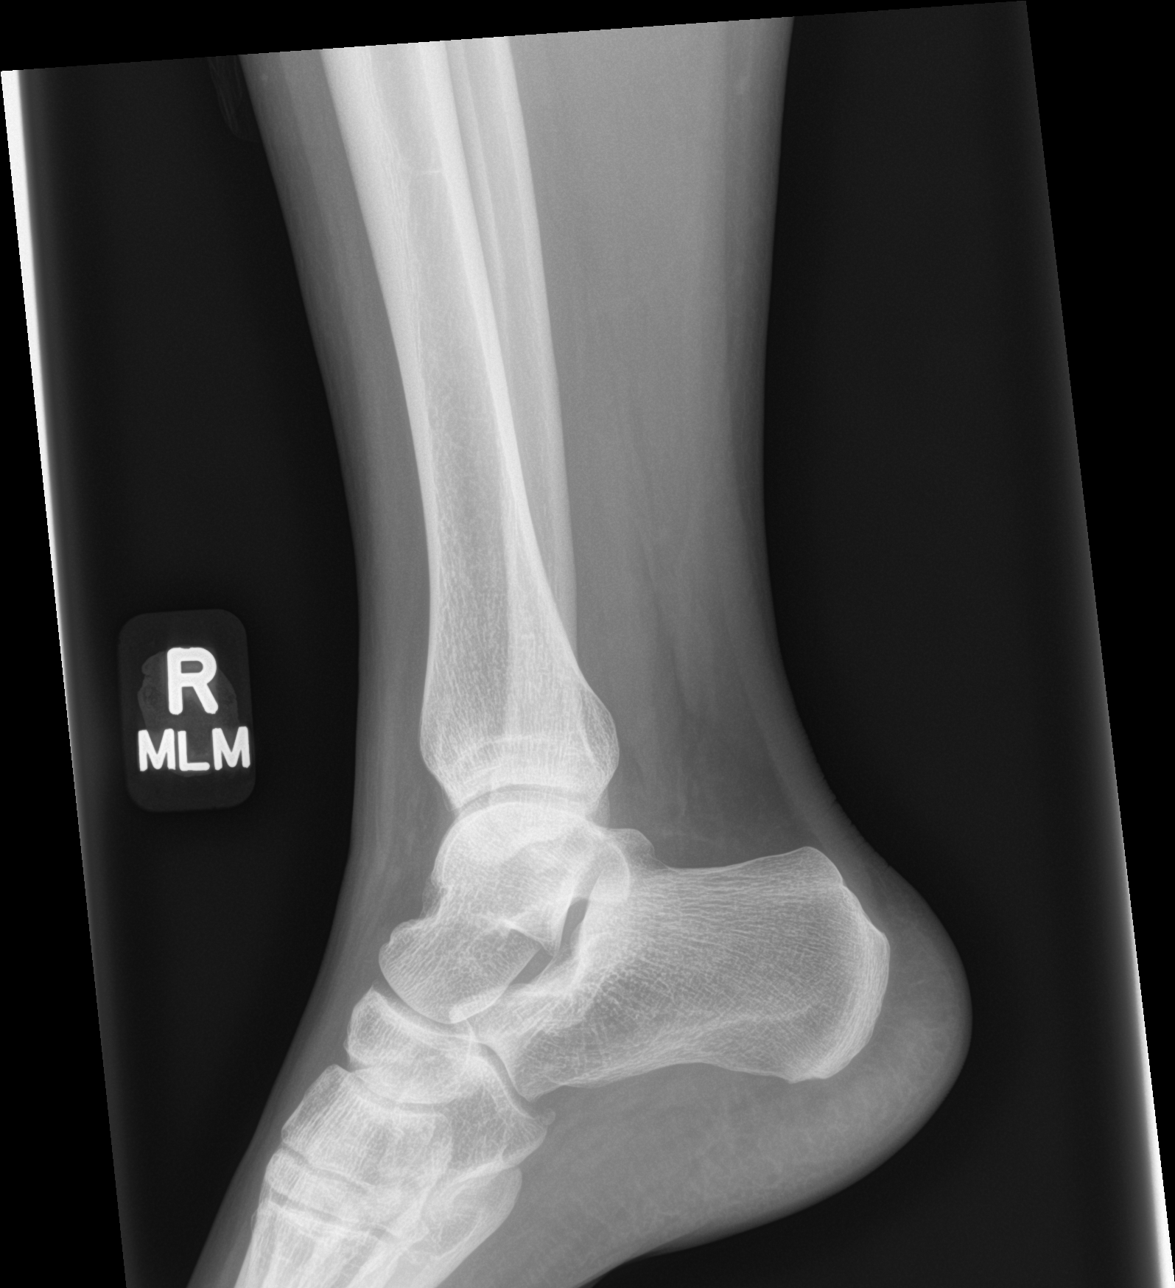

[3 of 3 positions shown; findings below may reference images not displayed]

FINDINGS: Bone mineralization is within normal limits. There is no evidence of
fracture, dislocation, or joint effusion. There is no evidence of
arthropathy or other focal bone abnormality. Soft tissues are
unremarkable.
IMPRESSION: Negative.

## 2023-01-16 DIAGNOSIS — F331 Major depressive disorder, recurrent, moderate: Secondary | ICD-10-CM | POA: Diagnosis not present

## 2023-01-16 DIAGNOSIS — F411 Generalized anxiety disorder: Secondary | ICD-10-CM | POA: Diagnosis not present

## 2023-01-21 DIAGNOSIS — E782 Mixed hyperlipidemia: Secondary | ICD-10-CM | POA: Diagnosis not present

## 2023-01-21 DIAGNOSIS — R7303 Prediabetes: Secondary | ICD-10-CM | POA: Diagnosis not present

## 2023-06-03 DIAGNOSIS — Z8781 Personal history of (healed) traumatic fracture: Secondary | ICD-10-CM | POA: Diagnosis not present

## 2023-06-03 DIAGNOSIS — M79641 Pain in right hand: Secondary | ICD-10-CM | POA: Diagnosis not present

## 2023-07-09 DIAGNOSIS — R5383 Other fatigue: Secondary | ICD-10-CM | POA: Diagnosis not present

## 2023-07-09 DIAGNOSIS — Z1159 Encounter for screening for other viral diseases: Secondary | ICD-10-CM | POA: Diagnosis not present

## 2023-07-09 DIAGNOSIS — J069 Acute upper respiratory infection, unspecified: Secondary | ICD-10-CM | POA: Diagnosis not present

## 2023-07-15 DIAGNOSIS — H6691 Otitis media, unspecified, right ear: Secondary | ICD-10-CM | POA: Diagnosis not present

## 2023-07-15 DIAGNOSIS — Z1159 Encounter for screening for other viral diseases: Secondary | ICD-10-CM | POA: Diagnosis not present

## 2023-07-15 DIAGNOSIS — J069 Acute upper respiratory infection, unspecified: Secondary | ICD-10-CM | POA: Diagnosis not present

## 2023-11-23 DIAGNOSIS — S60411A Abrasion of left index finger, initial encounter: Secondary | ICD-10-CM | POA: Diagnosis not present

## 2023-11-23 DIAGNOSIS — W540XXA Bitten by dog, initial encounter: Secondary | ICD-10-CM | POA: Diagnosis not present

## 2023-12-03 DIAGNOSIS — N760 Acute vaginitis: Secondary | ICD-10-CM | POA: Diagnosis not present

## 2023-12-03 DIAGNOSIS — N390 Urinary tract infection, site not specified: Secondary | ICD-10-CM | POA: Diagnosis not present

## 2023-12-03 DIAGNOSIS — R7303 Prediabetes: Secondary | ICD-10-CM | POA: Diagnosis not present

## 2023-12-03 DIAGNOSIS — D649 Anemia, unspecified: Secondary | ICD-10-CM | POA: Diagnosis not present
